# Patient Record
Sex: Female | Born: 1985 | Race: White | Hispanic: No | Marital: Single | Smoking: Former smoker
Health system: Southern US, Community
[De-identification: ages and names within clinical notes are randomized; demographics above are authoritative.]

## PROBLEM LIST (undated history)

## (undated) DIAGNOSIS — F32A Depression, unspecified: Secondary | ICD-10-CM

## (undated) DIAGNOSIS — R51 Headache: Secondary | ICD-10-CM

## (undated) DIAGNOSIS — K219 Gastro-esophageal reflux disease without esophagitis: Secondary | ICD-10-CM

## (undated) DIAGNOSIS — Z87442 Personal history of urinary calculi: Secondary | ICD-10-CM

## (undated) DIAGNOSIS — F329 Major depressive disorder, single episode, unspecified: Secondary | ICD-10-CM

## (undated) DIAGNOSIS — G4700 Insomnia, unspecified: Secondary | ICD-10-CM

## (undated) DIAGNOSIS — R519 Headache, unspecified: Secondary | ICD-10-CM

## (undated) DIAGNOSIS — F419 Anxiety disorder, unspecified: Secondary | ICD-10-CM

## (undated) HISTORY — PX: WISDOM TOOTH EXTRACTION: SHX21

## (undated) HISTORY — PX: MULTIPLE TOOTH EXTRACTIONS: SHX2053

---

## 2011-10-19 NOTE — L&D Delivery Note (Signed)
Delivery Note At 9:48 PM a viable female was delivered via Vaginal, Spontaneous Delivery (Presentation: Middle Occiput Anterior).  APGAR: 9, 9; weight pending .   Placenta status: Intact, Spontaneous.  Cord: 3 vessels.  Anesthesia: None  Episiotomy: None Lacerations: None Est. Blood Loss (mL): 250  Mom to postpartum.  Baby to nursery-stable.  Lawernce Pitts 05/25/2012, 10:50 PM

## 2011-10-19 NOTE — L&D Delivery Note (Signed)
I was present for this delivery and agree with the above resident's note.  LEFTWICH-KIRBY, Matteus Mcnelly Certified Nurse-Midwife 

## 2012-05-21 ENCOUNTER — Inpatient Hospital Stay (HOSPITAL_COMMUNITY)
Admission: AD | Admit: 2012-05-21 | Discharge: 2012-05-21 | Disposition: A | Discharge status: Home / Self Care | Payer: Medicaid Other | Source: Ambulatory Visit | Attending: Obstetrics & Gynecology | Admitting: Obstetrics & Gynecology

## 2012-05-21 ENCOUNTER — Encounter (HOSPITAL_COMMUNITY): Payer: Self-pay

## 2012-05-21 DIAGNOSIS — M545 Low back pain, unspecified: Secondary | ICD-10-CM | POA: Insufficient documentation

## 2012-05-21 DIAGNOSIS — O479 False labor, unspecified: Secondary | ICD-10-CM

## 2012-05-21 DIAGNOSIS — O99891 Other specified diseases and conditions complicating pregnancy: Secondary | ICD-10-CM | POA: Insufficient documentation

## 2012-05-21 DIAGNOSIS — N949 Unspecified condition associated with female genital organs and menstrual cycle: Secondary | ICD-10-CM | POA: Insufficient documentation

## 2012-05-21 HISTORY — DX: Anxiety disorder, unspecified: F41.9

## 2012-05-21 HISTORY — DX: Depression, unspecified: F32.A

## 2012-05-21 HISTORY — DX: Major depressive disorder, single episode, unspecified: F32.9

## 2012-05-21 HISTORY — DX: Insomnia, unspecified: G47.00

## 2012-05-21 LAB — CBC
Hemoglobin: 10.1 g/dL — ABNORMAL LOW (ref 12.0–15.0)
RBC: 4.06 MIL/uL (ref 3.87–5.11)

## 2012-05-21 LAB — URINALYSIS, ROUTINE W REFLEX MICROSCOPIC
Bilirubin Urine: NEGATIVE
Hgb urine dipstick: NEGATIVE
Specific Gravity, Urine: 1.015 (ref 1.005–1.030)
Urobilinogen, UA: 0.2 mg/dL (ref 0.0–1.0)
pH: 6.5 (ref 5.0–8.0)

## 2012-05-21 LAB — DIFFERENTIAL
Basophils Absolute: 0 10*3/uL (ref 0.0–0.1)
Basophils Relative: 0 % (ref 0–1)
Eosinophils Absolute: 0.1 10*3/uL (ref 0.0–0.7)
Eosinophils Relative: 1 % (ref 0–5)
Lymphocytes Relative: 6 % — ABNORMAL LOW (ref 12–46)
Lymphs Abs: 0.8 10*3/uL (ref 0.7–4.0)
Neutro Abs: 12.3 10*3/uL — ABNORMAL HIGH (ref 1.7–7.7)
Neutrophils Relative %: 87 % — ABNORMAL HIGH (ref 43–77)

## 2012-05-21 LAB — RUBELLA SCREEN: Rubella: 48.1 IU/mL — ABNORMAL HIGH

## 2012-05-21 LAB — ABO/RH: ABO/RH(D): A POS

## 2012-05-21 LAB — RPR: RPR Ser Ql: NONREACTIVE

## 2012-05-21 MED ORDER — ZOLPIDEM TARTRATE 5 MG PO TABS
5.0000 mg | ORAL_TABLET | ORAL | Status: AC
  Administered 2012-05-21: 5 mg via ORAL
  Filled 2012-05-21: qty 1

## 2012-05-21 NOTE — MAU Note (Signed)
Patient is experiencing lower back pain and lower pelvic pain, very uncomfortable and very painful, rates pain 10 out of 10, no vaginal bleeding, milky white scant discharge

## 2012-05-23 ENCOUNTER — Encounter (HOSPITAL_COMMUNITY): Payer: Self-pay | Admitting: *Deleted

## 2012-05-23 ENCOUNTER — Inpatient Hospital Stay (HOSPITAL_COMMUNITY)
Admission: AD | Admit: 2012-05-23 | Discharge: 2012-05-24 | Disposition: A | Discharge status: Home / Self Care | Payer: Medicaid Other | Source: Ambulatory Visit | Attending: Family Medicine | Admitting: Family Medicine

## 2012-05-23 DIAGNOSIS — O479 False labor, unspecified: Secondary | ICD-10-CM | POA: Insufficient documentation

## 2012-05-23 LAB — CULTURE, BETA STREP (GROUP B ONLY)

## 2012-05-23 NOTE — MAU Note (Signed)
Patient presented with increased UCs since 2030.  Denies leaking or bleeding and feeling positive fetal movement.

## 2012-05-23 NOTE — MAU Note (Signed)
HURT BAD AT 830PM.      WAS SEEN LAST  IN MAY- IN [facility].  WAS IN MAU ON Sunday- 4 CM- UC STOPPED - D/C HOME.     DENIES HSV AN MRSA.

## 2012-05-25 ENCOUNTER — Inpatient Hospital Stay (HOSPITAL_COMMUNITY)
Admission: AD | Admit: 2012-05-25 | Discharge: 2012-05-27 | DRG: 775 | Disposition: A | Discharge status: Home / Self Care | Payer: Medicaid Other | Source: Ambulatory Visit | Attending: Obstetrics & Gynecology | Admitting: Obstetrics & Gynecology

## 2012-05-25 ENCOUNTER — Encounter (HOSPITAL_COMMUNITY): Payer: Self-pay | Admitting: *Deleted

## 2012-05-25 LAB — CBC
HCT: 33.2 % — ABNORMAL LOW (ref 36.0–46.0)
Hemoglobin: 10.8 g/dL — ABNORMAL LOW (ref 12.0–15.0)
MCHC: 32.5 g/dL (ref 30.0–36.0)
WBC: 15.1 10*3/uL — ABNORMAL HIGH (ref 4.0–10.5)

## 2012-05-25 MED ORDER — IBUPROFEN 600 MG PO TABS
600.0000 mg | ORAL_TABLET | Freq: Four times a day (QID) | ORAL | Status: DC | PRN
  Administered 2012-05-25: 600 mg via ORAL
  Filled 2012-05-25: qty 1

## 2012-05-25 MED ORDER — TETANUS-DIPHTH-ACELL PERTUSSIS 5-2.5-18.5 LF-MCG/0.5 IM SUSP: 0.5000 mL | Freq: Once | INTRAMUSCULAR | Status: DC

## 2012-05-25 MED ORDER — OXYCODONE-ACETAMINOPHEN 5-325 MG PO TABS
1.0000 | ORAL_TABLET | ORAL | Status: DC | PRN
Start: 2012-05-25 — End: 2012-05-27

## 2012-05-25 MED ORDER — LIDOCAINE HCL (PF) 1 % IJ SOLN: 30.0000 mL | INTRAMUSCULAR | Status: DC | PRN

## 2012-05-25 MED ORDER — BENZOCAINE-MENTHOL 20-0.5 % EX AERO: 1.0000 "application " | INHALATION_SPRAY | CUTANEOUS | Status: DC | PRN

## 2012-05-25 MED ORDER — ONDANSETRON HCL 4 MG/2ML IJ SOLN: 4.0000 mg | Freq: Four times a day (QID) | INTRAMUSCULAR | Status: DC | PRN

## 2012-05-25 MED ORDER — OXYTOCIN 40 UNITS IN LACTATED RINGERS INFUSION - SIMPLE MED
INTRAVENOUS | Status: AC
  Administered 2012-05-25: 40 [IU]
  Filled 2012-05-25: qty 1000

## 2012-05-25 MED ORDER — DIPHENHYDRAMINE HCL 25 MG PO CAPS: 25.0000 mg | ORAL_CAPSULE | Freq: Four times a day (QID) | ORAL | Status: DC | PRN

## 2012-05-25 MED ORDER — LACTATED RINGERS IV SOLN: 500.0000 mL | INTRAVENOUS | Status: DC | PRN

## 2012-05-25 MED ORDER — CITRIC ACID-SODIUM CITRATE 334-500 MG/5ML PO SOLN: 30.0000 mL | ORAL | Status: DC | PRN

## 2012-05-25 MED ORDER — OXYCODONE-ACETAMINOPHEN 5-325 MG PO TABS
1.0000 | ORAL_TABLET | ORAL | Status: DC | PRN
  Administered 2012-05-25: 1 via ORAL
  Filled 2012-05-25: qty 1

## 2012-05-25 MED ORDER — LIDOCAINE HCL (PF) 1 % IJ SOLN
INTRAMUSCULAR | Status: AC
  Filled 2012-05-25: qty 30

## 2012-05-25 MED ORDER — IBUPROFEN 600 MG PO TABS
600.0000 mg | ORAL_TABLET | Freq: Four times a day (QID) | ORAL | Status: DC
  Administered 2012-05-26 – 2012-05-27 (×5): 600 mg via ORAL
  Filled 2012-05-25 (×6): qty 1

## 2012-05-25 MED ORDER — WITCH HAZEL-GLYCERIN EX PADS: 1.0000 "application " | MEDICATED_PAD | CUTANEOUS | Status: DC | PRN

## 2012-05-25 MED ORDER — OXYTOCIN BOLUS FROM INFUSION
250.0000 mL | Freq: Once | INTRAVENOUS | Status: DC
  Filled 2012-05-25: qty 500

## 2012-05-25 MED ORDER — ACETAMINOPHEN 325 MG PO TABS: 650.0000 mg | ORAL_TABLET | ORAL | Status: DC | PRN

## 2012-05-25 MED ORDER — PRENATAL MULTIVITAMIN CH
1.0000 | ORAL_TABLET | Freq: Every day | ORAL | Status: DC
  Administered 2012-05-27: 1 via ORAL
  Filled 2012-05-25 (×2): qty 1

## 2012-05-25 MED ORDER — FLEET ENEMA 7-19 GM/118ML RE ENEM: 1.0000 | ENEMA | RECTAL | Status: DC | PRN

## 2012-05-25 MED ORDER — DIBUCAINE 1 % RE OINT: 1.0000 "application " | TOPICAL_OINTMENT | RECTAL | Status: DC | PRN

## 2012-05-25 MED ORDER — SENNOSIDES-DOCUSATE SODIUM 8.6-50 MG PO TABS
2.0000 | ORAL_TABLET | Freq: Every day | ORAL | Status: DC
  Administered 2012-05-26: 2 via ORAL

## 2012-05-25 MED ORDER — OXYTOCIN 40 UNITS IN LACTATED RINGERS INFUSION - SIMPLE MED: 62.5000 mL/h | Freq: Once | INTRAVENOUS | Status: DC

## 2012-05-25 MED ORDER — LANOLIN HYDROUS EX OINT: TOPICAL_OINTMENT | CUTANEOUS | Status: DC | PRN

## 2012-05-25 MED ORDER — SIMETHICONE 80 MG PO CHEW: 80.0000 mg | CHEWABLE_TABLET | ORAL | Status: DC | PRN

## 2012-05-25 MED ORDER — LACTATED RINGERS IV SOLN: INTRAVENOUS | Status: DC

## 2012-05-25 MED ORDER — ONDANSETRON HCL 4 MG/2ML IJ SOLN: 4.0000 mg | INTRAMUSCULAR | Status: DC | PRN

## 2012-05-25 MED ORDER — ZOLPIDEM TARTRATE 5 MG PO TABS: 5.0000 mg | ORAL_TABLET | Freq: Every evening | ORAL | Status: DC | PRN

## 2012-05-25 MED ORDER — ONDANSETRON HCL 4 MG PO TABS: 4.0000 mg | ORAL_TABLET | ORAL | Status: DC | PRN

## 2012-05-25 NOTE — H&P (Signed)
Kristine Mosley is a 26 y.o. female presenting for regular ctx and bloody show this afternoon. Maternal Medical History:  Reason for admission: Reason for admission: contractions.  Contractions: Onset was 1-2 hours ago.   Frequency: regular.   Perceived severity is strong.    Fetal activity: Perceived fetal activity is decreased.   Last perceived fetal movement was within the past 12 hours.     Pt reports normal u/s and genetic screen but records unavailable. OB History    Grav Para Term Preterm Abortions TAB SAB Ect Mult Living   3 1 1  1  1   1      Past Medical History  Diagnosis Date  . Depression   . Anxiety   . Insomnia    History reviewed. No pertinent past surgical history. Family History: family history includes Cancer in her father and Heart disease in her mother. Social History:  reports that she has been smoking Cigarettes.  She has a 10 pack-year smoking history. She does not have any smokeless tobacco history on file. She reports that she does not drink alcohol or use illicit drugs.   Prenatal Transfer Tool  Maternal Diabetes: No Genetic Screening: Normal Maternal Ultrasounds/Referrals: Normal Fetal Ultrasounds or other Referrals:  None Maternal Substance Abuse:  No Significant Maternal Medications:  None Significant Maternal Lab Results:  None Other Comments:  limited PNC  Review of Systems  Constitutional: Negative.   HENT: Negative.   Eyes: Negative.   Respiratory: Negative.   Cardiovascular: Negative.   Gastrointestinal: Negative.   Genitourinary: Negative.   Skin: Negative.   Neurological: Negative.    SVE 6-7/100/-1 done by Marian Meneely,SNM Maternal Exam:  Uterine Assessment: Contraction strength is moderate.  Contraction frequency is regular.   Abdomen: Patient reports no abdominal tenderness. Fetal presentation: vertex  Introitus: Normal vulva. Normal vagina.    Fetal Exam Fetal Monitor Review: Mode: ultrasound.   Baseline rate: 130.    Variability: moderate (6-25 bpm).   Pattern: accelerations present and no decelerations.    Fetal State Assessment: Category I - tracings are normal.     Physical Exam  Constitutional: She is oriented to person, place, and time. She appears well-developed.  HENT:  Head: Normocephalic.  Neck: Normal range of motion.  Cardiovascular: Normal rate.   Respiratory: Effort normal.  GI: Soft.  Genitourinary: Vagina normal.  Musculoskeletal: Normal range of motion.  Neurological: She is alert and oriented to person, place, and time.  Skin: Skin is warm and dry.  Psychiatric: She has a normal mood and affect.    Prenatal labs: ABO, Rh: --/--/A POS, A POS (08/04 0214) Antibody: NEG (08/04 0214) Rubella: 48.1 (08/04 0229) RPR: NON REACTIVE (08/04 0229)  HBsAg: NEGATIVE (08/04 0229)  HIV:    GBS:   NEG  Assessment/Plan: IUP@ [redacted]w[redacted]d Active labor Limited PNC GBS neg  Admit, fetal monitoring per unit protocol, epidural PRN, expectant management.   Lawernce Pitts 05/25/2012, 9:23 PM

## 2012-05-25 NOTE — MAU Note (Signed)
Elbert Ewings Leftwich-Kirby CNM notified of pt.  Orders rec'd.

## 2012-05-25 NOTE — Treatment Plan (Signed)
Patient may go to room 172

## 2012-05-25 NOTE — MAU Note (Signed)
Pt presents with complaints of bleeding this pm approx ago. Denies any leakage of fluid. States baby is active.

## 2012-05-26 LAB — RPR: RPR Ser Ql: NONREACTIVE

## 2012-05-26 MED ORDER — IBUPROFEN 600 MG PO TABS: 600.0000 mg | ORAL_TABLET | Freq: Four times a day (QID) | ORAL | Status: AC
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
## 2012-05-26 NOTE — Progress Notes (Signed)
I have seen this patient and agree with the above student's note.    LEFTWICH-KIRBY, Valeria Krisko Certified Nurse-Midwife 

## 2012-05-26 NOTE — Progress Notes (Signed)
Post Partum Day 1 Subjective: Feeling good no complaints, up ad lib, voiding and tolerating PO Would like to d/c home later today.  Objective: Blood pressure 107/71, pulse 78, temperature 98.3 F (36.8 C), temperature source Oral, resp. rate 18, unknown if currently breastfeeding.  Physical Exam:  General: alert and cooperative ABD: soft, non-tender Lochia: appropriate Uterine Fundus: firm DVT Evaluation: No evidence of DVT seen on physical exam.   Basename 05/25/12 2115  HGB 10.8*  HCT 33.2*    Assessment/Plan: pp day 1, SVD Breastfeeding Contraception desires BTL, will consent and initiate 30 day papers  Will re-evaluate later today for discharge home.  LOS: 1 day   Kristine Mosley 05/26/2012, 7:45 AM

## 2012-05-26 NOTE — Progress Notes (Signed)
UR chart review completed.  

## 2012-05-26 NOTE — Discharge Summary (Signed)
Obstetric Discharge Summary Reason for Admission: onset of labor Prenatal Procedures: NST and ultrasound Intrapartum Procedures: spontaneous vaginal delivery Postpartum Procedures: none Complications-Operative and Postpartum: none Hemoglobin  Date Value Range Status  05/25/2012 10.8* 12.0 - 15.0 g/dL Final     HCT  Date Value Range Status  05/25/2012 33.2* 36.0 - 46.0 % Final    Physical Exam:  General: alert and cooperative Lochia: appropriate Uterine Fundus: firm Incision: n/a DVT Evaluation: No evidence of DVT seen on physical exam.  Discharge Diagnoses: Term Pregnancy-delivered  Discharge Information: Date: 05/26/2012 Activity: pelvic rest Diet: routine Medications: Ibuprofen Condition: stable Instructions: refer to practice specific booklet Discharge to: home Follow-up Information    Schedule an appointment as soon as possible for a visit with [facility].         Newborn Data: Live born female  Birth Weight: 9 lb 6.6 oz (4270 g) APGAR: 9, 9  Home with mother.  Kristine Mosley E. 05/26/2012, 8:58 AM

## 2012-05-27 NOTE — Discharge Summary (Addendum)
Obstetric Discharge Summary Reason for Admission: onset of labor Prenatal Procedures: none Intrapartum Procedures: spontaneous vaginal delivery Postpartum Procedures: none Complications-Operative and Postpartum: none Hemoglobin  Date Value Range Status  05/25/2012 10.8* 12.0 - 15.0 g/dL Final     HCT  Date Value Range Status  05/25/2012 33.2* 36.0 - 46.0 % Final    Physical Exam:  Filed Vitals:   05/27/12 0527  BP: 118/76  Pulse: 76  Temp: 98 F (36.7 C)  Resp: 18    General: alert, cooperative and no distress Lochia: appropriate Uterine Fundus: firm DVT Evaluation: No evidence of DVT seen on physical exam.  Discharge Diagnoses: Term Pregnancy-delivered  Discharge Information: Date: 05/27/2012 Activity: pelvic rest for 6 weeks Diet: routine Medications: Ibuprofen Condition: stable Instructions: refer to practice specific booklet Discharge to: home Follow-up Information    Schedule an appointment as soon as possible for a visit with [facility] or [facility]. (Make appt at [facility] [phone] in 2 wks  to set up tubal ligation.)          Newborn Data: Live born female  Birth Weight: 9 lb 6.6 oz (4270 g) APGAR: 9, 9 Breastfeeding. Plans to have tubal ligation post-partum. Home with baby.  Napoleon Form 05/27/2012, 6:22 AM

## 2012-06-22 ENCOUNTER — Ambulatory Visit (INDEPENDENT_AMBULATORY_CARE_PROVIDER_SITE_OTHER): Payer: Medicaid Other | Admitting: Obstetrics & Gynecology

## 2012-06-22 ENCOUNTER — Encounter: Payer: Self-pay | Admitting: Obstetrics & Gynecology

## 2012-06-22 DIAGNOSIS — O99345 Other mental disorders complicating the puerperium: Secondary | ICD-10-CM

## 2012-06-22 DIAGNOSIS — Z3049 Encounter for surveillance of other contraceptives: Secondary | ICD-10-CM

## 2012-06-22 MED ORDER — MEDROXYPROGESTERONE ACETATE 150 MG/ML IM SUSP
150.0000 mg | Freq: Once | INTRAMUSCULAR | Status: AC
  Administered 2012-06-22: 150 mg via INTRAMUSCULAR

## 2012-06-22 MED ORDER — MEDROXYPROGESTERONE ACETATE 150 MG/ML IM SUSP: 150.0000 mg | INTRAMUSCULAR | Status: DC

## 2012-06-22 NOTE — Addendum Note (Signed)
Addended by: Sherre Lain A on: 06/22/2012 03:30 PM   Modules accepted: Orders

## 2012-06-22 NOTE — Progress Notes (Signed)
States here for postpartum visit , has decided to not get btl, wants depoprovera

## 2012-06-22 NOTE — Patient Instructions (Signed)
Contraception Choices Contraception (birth control) is the use of any methods or devices to prevent pregnancy. Below are some methods to help avoid pregnancy. HORMONAL METHODS   Contraceptive implant. This is a thin, plastic tube containing progesterone hormone. It does not contain estrogen hormone. Your caregiver inserts the tube in the inner part of the upper arm. The tube can remain in place for up to 3 years. After 3 years, the implant must be removed. The implant prevents the ovaries from releasing an egg (ovulation), thickens the cervical mucus which prevents sperm from entering the uterus, and thins the lining of the inside of the uterus.   Progesterone-only injections. These injections are given every 3 months by your caregiver to prevent pregnancy. This synthetic progesterone hormone stops the ovaries from releasing eggs. It also thickens cervical mucus and changes the uterine lining. This makes it harder for sperm to survive in the uterus.   Birth control pills. These pills contain estrogen and progesterone hormone. They work by stopping the egg from forming in the ovary (ovulation). Birth control pills are prescribed by a caregiver.Birth control pills can also be used to treat heavy periods.   Minipill. This type of birth control pill contains only the progesterone hormone. They are taken every day of each month and must be prescribed by your caregiver.   Birth control patch. The patch contains hormones similar to those in birth control pills. It must be changed once a week and is prescribed by a caregiver.   Vaginal ring. The ring contains hormones similar to those in birth control pills. It is left in the vagina for 3 weeks, removed for 1 week, and then a new one is put back in place. The patient must be comfortable inserting and removing the ring from the vagina.A caregiver's prescription is necessary.   Emergency contraception. Emergency contraceptives prevent pregnancy after  unprotected sexual intercourse. This pill can be taken right after sex or up to 5 days after unprotected sex. It is most effective the sooner you take the pills after having sexual intercourse. Emergency contraceptive pills are available without a prescription. Check with your pharmacist. Do not use emergency contraception as your only form of birth control.  BARRIER METHODS   Female condom. This is a thin sheath (latex or rubber) that is worn over the penis during sexual intercourse. It can be used with spermicide to increase effectiveness.   Female condom. This is a soft, loose-fitting sheath that is put into the vagina before sexual intercourse.   Diaphragm. This is a soft, latex, dome-shaped barrier that must be fitted by a caregiver. It is inserted into the vagina, along with a spermicidal jelly. It is inserted before intercourse. The diaphragm should be left in the vagina for 6 to 8 hours after intercourse.   Cervical cap. This is a round, soft, latex or plastic cup that fits over the cervix and must be fitted by a caregiver. The cap can be left in place for up to 48 hours after intercourse.   Sponge. This is a soft, circular piece of polyurethane foam. The sponge has spermicide in it. It is inserted into the vagina after wetting it and before sexual intercourse.   Spermicides. These are chemicals that kill or block sperm from entering the cervix and uterus. They come in the form of creams, jellies, suppositories, foam, or tablets. They do not require a prescription. They are inserted into the vagina with an applicator before having sexual intercourse. The process must be   repeated every time you have sexual intercourse.  INTRAUTERINE CONTRACEPTION  Intrauterine device (IUD). This is a T-shaped device that is put in a woman's uterus during a menstrual period to prevent pregnancy. There are 2 types:   Copper IUD. This type of IUD is wrapped in copper wire and is placed inside the uterus. Copper  makes the uterus and fallopian tubes produce a fluid that kills sperm. It can stay in place for 10 years.   Hormone IUD. This type of IUD contains the hormone progestin (synthetic progesterone). The hormone thickens the cervical mucus and prevents sperm from entering the uterus, and it also thins the uterine lining to prevent implantation of a fertilized egg. The hormone can weaken or kill the sperm that get into the uterus. It can stay in place for 5 years.  PERMANENT METHODS OF CONTRACEPTION  Female tubal ligation. This is when the woman's fallopian tubes are surgically sealed, tied, or blocked to prevent the egg from traveling to the uterus.   Female sterilization. This is when the female has the tubes that carry sperm tied off (vasectomy).This blocks sperm from entering the vagina during sexual intercourse. After the procedure, the man can still ejaculate fluid (semen).  NATURAL PLANNING METHODS  Natural family planning. This is not having sexual intercourse or using a barrier method (condom, diaphragm, cervical cap) on days the woman could become pregnant.   Calendar method. This is keeping track of the length of each menstrual cycle and identifying when you are fertile.   Ovulation method. This is avoiding sexual intercourse during ovulation.   Symptothermal method. This is avoiding sexual intercourse during ovulation, using a thermometer and ovulation symptoms.   Post-ovulation method. This is timing sexual intercourse after you have ovulated.  Regardless of which type or method of contraception you choose, it is important that you use condoms to protect against the transmission of sexually transmitted diseases (STDs). Talk with your caregiver about which form of contraception is most appropriate for you. Document Released: 10/04/2005 Document Revised: 09/23/2011 Document Reviewed: 02/10/2011 [employer] Patient Information 2012 [employer]. 

## 2012-06-22 NOTE — Progress Notes (Signed)
  Subjective:     Kristine Mosley is a 26 y.o. female who presents for a postpartum visit. She is 4 week postpartum following a spontaneous vaginal delivery. I have fully reviewed the prenatal and intrapartum course. The delivery was at 39 gestational weeks. Outcome: spontaneous vaginal delivery. Anesthesia: epidural. Postpartum course has been uncomplicated. Baby's course has been unremarkable. Baby is feeding by breast. Bleeding thin lochia. Bowel function is normal. Bladder function is normal. Patient is not sexually active. Contraception method is none. Postpartum depression screening: negative.  The following portions of the patient's history were reviewed and updated as appropriate: past family history and past surgical history.  Review of Systems Pertinent items are noted in HPI.   Objective:    BP 109/73  Pulse 97  Temp 98.2 F (36.8 C)  Wt 124 lb 1.6 oz (56.291 kg)  Breastfeeding? Yes  General:  cooperative and no distress                                      Assessment:    6 week postpartum exam.   Plan:    1. Contraception: Depo-Provera injections 2. Encouraged tobacco cessation 3. Follow up in: 3 months or as needed.

## 2012-09-08 ENCOUNTER — Ambulatory Visit (INDEPENDENT_AMBULATORY_CARE_PROVIDER_SITE_OTHER): Payer: Medicaid Other | Admitting: General Practice

## 2012-09-08 VITALS — BP 116/73 | HR 115 | Temp 96.5°F | Ht 68.5 in | Wt 119.5 lb

## 2012-09-08 DIAGNOSIS — Z3049 Encounter for surveillance of other contraceptives: Secondary | ICD-10-CM

## 2012-09-08 MED ORDER — MEDROXYPROGESTERONE ACETATE 150 MG/ML IM SUSP
150.0000 mg | Freq: Once | INTRAMUSCULAR | Status: AC
  Administered 2012-09-08: 150 mg via INTRAMUSCULAR

## 2012-11-24 ENCOUNTER — Ambulatory Visit: Payer: Medicaid Other

## 2012-11-27 ENCOUNTER — Ambulatory Visit (INDEPENDENT_AMBULATORY_CARE_PROVIDER_SITE_OTHER): Payer: Medicaid Other

## 2012-11-27 VITALS — BP 102/68 | HR 81 | Temp 97.1°F | Ht 69.0 in | Wt 125.3 lb

## 2012-11-27 DIAGNOSIS — Z3049 Encounter for surveillance of other contraceptives: Secondary | ICD-10-CM

## 2012-11-27 MED ORDER — MEDROXYPROGESTERONE ACETATE 150 MG/ML IM SUSP
150.0000 mg | Freq: Once | INTRAMUSCULAR | Status: AC
  Administered 2012-11-27: 150 mg via INTRAMUSCULAR

## 2013-02-12 ENCOUNTER — Ambulatory Visit (INDEPENDENT_AMBULATORY_CARE_PROVIDER_SITE_OTHER): Payer: Medicaid Other

## 2013-02-12 VITALS — BP 121/89 | HR 105 | Temp 98.4°F | Ht 66.0 in | Wt 125.5 lb

## 2013-02-12 DIAGNOSIS — Z3049 Encounter for surveillance of other contraceptives: Secondary | ICD-10-CM

## 2013-02-12 DIAGNOSIS — Z3042 Encounter for surveillance of injectable contraceptive: Secondary | ICD-10-CM

## 2013-02-12 MED ORDER — MEDROXYPROGESTERONE ACETATE 150 MG/ML IM SUSP
150.0000 mg | Freq: Once | INTRAMUSCULAR | Status: AC
  Administered 2013-02-12: 150 mg via INTRAMUSCULAR

## 2015-02-28 ENCOUNTER — Emergency Department (HOSPITAL_COMMUNITY)
Admission: EM | Admit: 2015-02-28 | Discharge: 2015-02-28 | Disposition: A | Discharge status: Home / Self Care | Payer: Medicaid Other | Attending: Emergency Medicine | Admitting: Emergency Medicine

## 2015-02-28 ENCOUNTER — Emergency Department (HOSPITAL_COMMUNITY): Payer: Medicaid Other

## 2015-02-28 ENCOUNTER — Encounter (HOSPITAL_COMMUNITY): Payer: Self-pay | Admitting: Emergency Medicine

## 2015-02-28 DIAGNOSIS — Z8669 Personal history of other diseases of the nervous system and sense organs: Secondary | ICD-10-CM | POA: Insufficient documentation

## 2015-02-28 DIAGNOSIS — J069 Acute upper respiratory infection, unspecified: Secondary | ICD-10-CM | POA: Insufficient documentation

## 2015-02-28 DIAGNOSIS — R112 Nausea with vomiting, unspecified: Secondary | ICD-10-CM | POA: Insufficient documentation

## 2015-02-28 DIAGNOSIS — Z8659 Personal history of other mental and behavioral disorders: Secondary | ICD-10-CM | POA: Diagnosis not present

## 2015-02-28 DIAGNOSIS — Z720 Tobacco use: Secondary | ICD-10-CM | POA: Insufficient documentation

## 2015-02-28 DIAGNOSIS — Z79899 Other long term (current) drug therapy: Secondary | ICD-10-CM | POA: Diagnosis not present

## 2015-02-28 DIAGNOSIS — F17200 Nicotine dependence, unspecified, uncomplicated: Secondary | ICD-10-CM

## 2015-02-28 DIAGNOSIS — M791 Myalgia: Secondary | ICD-10-CM | POA: Diagnosis not present

## 2015-02-28 DIAGNOSIS — R062 Wheezing: Secondary | ICD-10-CM

## 2015-02-28 DIAGNOSIS — J029 Acute pharyngitis, unspecified: Secondary | ICD-10-CM | POA: Diagnosis present

## 2015-02-28 MED ORDER — IBUPROFEN 400 MG PO TABS
600.0000 mg | ORAL_TABLET | Freq: Once | ORAL | Status: AC
  Administered 2015-02-28: 600 mg via ORAL
  Filled 2015-02-28: qty 2

## 2015-02-28 MED ORDER — PREDNISONE 20 MG PO TABS: ORAL_TABLET | ORAL | Status: DC

## 2015-02-28 MED ORDER — ALBUTEROL SULFATE HFA 108 (90 BASE) MCG/ACT IN AERS
2.0000 | INHALATION_SPRAY | Freq: Once | RESPIRATORY_TRACT | Status: AC
  Administered 2015-02-28: 2 via RESPIRATORY_TRACT
  Filled 2015-02-28: qty 6.7

## 2015-02-28 MED ORDER — ONDANSETRON 4 MG PO TBDP
4.0000 mg | ORAL_TABLET | Freq: Once | ORAL | Status: AC
  Administered 2015-02-28: 4 mg via ORAL
  Filled 2015-02-28: qty 1

## 2015-02-28 MED ORDER — PREDNISONE 20 MG PO TABS
40.0000 mg | ORAL_TABLET | Freq: Once | ORAL | Status: AC
  Administered 2015-02-28: 40 mg via ORAL
  Filled 2015-02-28: qty 2

## 2015-02-28 NOTE — Discharge Instructions (Signed)
Return to the emergency room with worsening of symptoms, new symptoms or with symptoms that are concerning , , especially fevers, stiff neck, worsening headache, nausea/vomiting, visual changes or slurred speech, chest pain, shortness of breath, cough with thick colored mucous or blood Drink plenty of fluids with electrolytes especially Gatorade. OTC cold medications such as mucinex, nyquil, dayquil are recommended. Chloraseptic for sore throat. Prednisone for next four days. Albuterol inhaler as need every 4-6 hours. Please call your doctor for a followup appointment within 24-48 hours. When you talk to your doctor please let them know that you were seen in the emergency department and have them acquire all of your records so that they can discuss the findings with you and formulate a treatment plan to fully care for your new and ongoing problems. If you do not have a primary care provider please call the number below under ED resources to establish care with a provider and follow up.  Read below information and follow recommendations.  Chronic Obstructive Pulmonary Disease Chronic obstructive pulmonary disease (COPD) is a common lung condition in which airflow from the lungs is limited. COPD is a general term that can be used to describe many different lung problems that limit airflow, including both chronic bronchitis and emphysema. If you have COPD, your lung function will probably never return to normal, but there are measures you can take to improve lung function and make yourself feel better.  CAUSES   Smoking (common).   Exposure to secondhand smoke.   Genetic problems.  Chronic inflammatory lung diseases or recurrent infections. SYMPTOMS   Shortness of breath, especially with physical activity.   Deep, persistent (chronic) cough with a large amount of thick mucus.   Wheezing.   Rapid breaths (tachypnea).   Gray or bluish discoloration (cyanosis) of the skin, especially in  fingers, toes, or lips.   Fatigue.   Weight loss.   Frequent infections or episodes when breathing symptoms become much worse (exacerbations).   Chest tightness. DIAGNOSIS  Your health care provider will take a medical history and perform a physical examination to make the initial diagnosis. Additional tests for COPD may include:   Lung (pulmonary) function tests.  Chest X-ray.  CT scan.  Blood tests. TREATMENT  Treatment available to help you feel better when you have COPD includes:   Inhaler and nebulizer medicines. These help manage the symptoms of COPD and make your breathing more comfortable.  Supplemental oxygen. Supplemental oxygen is only helpful if you have a low oxygen level in your blood.   Exercise and physical activity. These are beneficial for nearly all people with COPD. Some people may also benefit from a pulmonary rehabilitation program. HOME CARE INSTRUCTIONS   Take all medicines (inhaled or pills) as directed by your health care provider.  Avoid over-the-counter medicines or cough syrups that dry up your airway (such as antihistamines) and slow down the elimination of secretions unless instructed otherwise by your health care provider.   If you are a smoker, the most important thing that you can do is stop smoking. Continuing to smoke will cause further lung damage and breathing trouble. Ask your health care provider for help with quitting smoking. He or she can direct you to community resources or hospitals that provide support.  Avoid exposure to irritants such as smoke, chemicals, and fumes that aggravate your breathing.  Use oxygen therapy and pulmonary rehabilitation if directed by your health care provider. If you require home oxygen therapy, ask your health care  provider whether you should purchase a pulse oximeter to measure your oxygen level at home.   Avoid contact with individuals who have a contagious illness.  Avoid extreme temperature  and humidity changes.  Eat healthy foods. Eating smaller, more frequent meals and resting before meals may help you maintain your strength.  Stay active, but balance activity with periods of rest. Exercise and physical activity will help you maintain your ability to do things you want to do.  Preventing infection and hospitalization is very important when you have COPD. Make sure to receive all the vaccines your health care provider recommends, especially the pneumococcal and influenza vaccines. Ask your health care provider whether you need a pneumonia vaccine.  Learn and use relaxation techniques to manage stress.  Learn and use controlled breathing techniques as directed by your health care provider. Controlled breathing techniques include:   Pursed lip breathing. Start by breathing in (inhaling) through your nose for 1 second. Then, purse your lips as if you were going to whistle and breathe out (exhale) through the pursed lips for 2 seconds.   Diaphragmatic breathing. Start by putting one hand on your abdomen just above your waist. Inhale slowly through your nose. The hand on your abdomen should move out. Then purse your lips and exhale slowly. You should be able to feel the hand on your abdomen moving in as you exhale.   Learn and use controlled coughing to clear mucus from your lungs. Controlled coughing is a series of short, progressive coughs. The steps of controlled coughing are:  1. Lean your head slightly forward.  2. Breathe in deeply using diaphragmatic breathing.  3. Try to hold your breath for 3 seconds.  4. Keep your mouth slightly open while coughing twice.  5. Spit any mucus out into a tissue.  6. Rest and repeat the steps once or twice as needed. SEEK MEDICAL CARE IF:   You are coughing up more mucus than usual.   There is a change in the color or thickness of your mucus.   Your breathing is more labored than usual.   Your breathing is faster than usual.   SEEK IMMEDIATE MEDICAL CARE IF:   You have shortness of breath while you are resting.   You have shortness of breath that prevents you from:  Being able to talk.   Performing your usual physical activities.   You have chest pain lasting longer than 5 minutes.   Your skin color is more cyanotic than usual.  You measure low oxygen saturations for longer than 5 minutes with a pulse oximeter. MAKE SURE YOU:   Understand these instructions.  Will watch your condition.  Will get help right away if you are not doing well or get worse. Document Released: 07/14/2005 Document Revised: 02/18/2014 Document Reviewed: 05/31/2013 [facility] Patient Information 2015 [place], [place]. This information is not intended to replace advice given to you by your health care provider. Make sure you discuss any questions you have with your health care provider.   Emergency Department Resource Guide 1) Find a Doctor and Pay Out of Pocket Although you won't have to find out who is covered by your insurance plan, it is a good idea to ask around and get recommendations. You will then need to call the office and see if the doctor you have chosen will accept you as a new patient and what types of options they offer for patients who are self-pay. Some doctors offer discounts or will set up payment  plans for their patients who do not have insurance, but you will need to ask so you aren't surprised when you get to your appointment.  2) Contact Your Local Health Department Not all health departments have doctors that can see patients for sick visits, but many do, so it is worth a call to see if yours does. If you don't know where your local health department is, you can check in your phone book. The CDC also has a tool to help you locate your state's health department, and many state websites also have listings of all of their local health departments.  3) Find a [facility] If your illness is not likely to be  very severe or complicated, you may want to try a walk in clinic. These are popping up all over the country in pharmacies, drugstores, and shopping centers. They're usually staffed by nurse practitioners or physician assistants that have been trained to treat common illnesses and complaints. They're usually fairly quick and inexpensive. However, if you have serious medical issues or chronic medical problems, these are probably not your best option.  No Primary Care Doctor: - Call Health Connect at  [phone] - they can help you locate a primary care doctor that  accepts your insurance, provides certain services, etc. - Physician Referral Service- [phone]  Chronic Pain Problems: Organization         Address  Phone   Notes  [facility]  [phone] Patients need to be referred by their primary care doctor.   Medication Assistance: Organization         Address  Phone   Notes  [facility] Medication [address] [facility] [address] [place], [place] [address] [phone] --Must be a resident of [facility] -- Must have NO insurance coverage whatsoever (no Medicaid/ Medicare, etc.) -- The pt. MUST have a primary care doctor that directs their care regularly and follows them in the community   Med[facility]ist  [phone]   [employer]  [phone]    Agencies that provide inexpensive medical care: Organization         Address  Phone   Notes  [place]  [phone]   [facility]    [phone]   [facility] [place], [place] [address] [phone]   [place] [address], [place] [phone]   Planned Parenthood    [phone]   [facility]    [phone]   [place] and [place] [address], [place] Phone:  [phone], Fax:  [phone] Hours of Operation:  9 am - 6 pm, M-F.  Also  accepts Medicaid/Medicare and self-pay.  [facility] for [place], [address], [place] Phone: [phone], Fax: [phone]. Hours of Operation:  8:30 am - 5:30 pm, M-F.  Also accepts Medicaid and self-pay.  [facility] [address], [place] Phone: [phone]   [place], [place], [place] [phone], Ext. [phone] Mondays & Thursdays: 7-9 AM.  First 15 patients are seen on a first come, first serve basis.    [place] Providers:  Organization         Address  Phone   Notes  [facility] [address], [place] [phone] Also accepts self-pay  patients.  [facility] [address], [place]  [phone]   [address], [place] [phone]   [facility] [address] [place] [phone]   Lucianne Lei [address], [place]   [phone] Only accepts [place] Access [place] patients after they have their name applied to their card.   Self-Pay (no insurance) in [facility]:  Organization         Address  Phone   Notes  Sickle Cell Patients, [facility] [place] [phone]   [facility] [place] [phone]   [facility] [place]  [place], [place], [place] [phone]   [facility]/Dr. Osei-Bonsu  [address] [place] or [address], [place] [phone] Phone number for both [place] and [place] locations is the same.  Urgent Medical and [facility] [address] [place] [phone]   [facility] [address] [place] or [address] [phone]   [facility] [address],  [place] [phone], phone; [phone], fax Sees patients 1st and 3rd Saturday of every month.  Must not qualify for public or private insurance (i.e. Medicaid, Medicare, [place] Health Choice, Veterans' Benefits)  Household income should be no more than 200% of the poverty level The clinic cannot treat you if you are pregnant or think you are pregnant  Sexually transmitted diseases are not treated at the clinic.    Dental Care: Organization         Address  Phone  Notes  [facility] Department of [facility] [phone] Accepts children up to age 56 who are enrolled in [place] or [place]; pregnant women with a Medicaid card; and children who have applied for Medicaid or [place] Health Choice, but were declined, whose parents can pay a reduced fee at time of service.  [facility] Department of [facility]  [address], [place] [phone] Accepts children up to age 4 who are enrolled in [place] or [place]; pregnant women with a Medicaid card; and children who have applied for Medicaid or  Health Choice, but were declined, whose parents can pay a reduced fee at time of service.  [place] Adult Dental Access PROGRAM  [place] [phone] Patients are seen by appointment only. Walk-ins are not accepted. [place] will see patients 34 years of age and older. Monday - Tuesday (8am-5pm) Most Wednesdays (8:30-5pm) $30 per visit, cash only  [facility] Adult Dental Access PROGRAM  [address], [facility] [phone] Patients are seen by appointment only. Walk-ins are not accepted. [place] will see patients 11 years of age and older. One Wednesday Evening (Monthly: Volunteer Based).  $30 per visit, cash only  [place]  [phone] for adults; Children under age 29, call Graduate Pediatric Dentistry at [phone].  Children aged 71-14, please call [phone] to request a pediatric application.  Dental services are provided in all areas of dental care including fillings, crowns and bridges, complete and partial dentures, implants, gum treatment, root canals, and extractions. Preventive care is also provided. Treatment is provided to  both adults and children. Patients are selected via a lottery and there is often a waiting list.   [facility] [address] [place]  [phone] [email or web address]   [facility] [address] [place], [place] [phone], Ext. [phone] Second and Fourth Thursday of each month, opens at 6:30 AM; Clinic ends at 9 AM.  Patients are seen on a first-come first-served basis, and a limited number are seen during each clinic.   [facility]  [address] Hillard Danker [place], [place] [phone]   Eligibility Requirements You must have lived in [place], [place], or [place] counties for at least the last three months.   You cannot be eligible for state or federal sponsored [employer], including [employer], [place], or [employer].   You generally cannot be eligible for healthcare insurance through your employer.    How to apply: Eligibility screenings are held every Tuesday and Wednesday afternoon from 1:00 pm until 4:00 pm. You do not need an appointment for the interview!  [facility] [address], [place], [place]   [place]  [place] Department  [place]  [phone]    Behavioral Health Resources in the Community: Intensive Outpatient Programs Organization         Address  Phone  Notes  [place]o [place]. [address], [place], [place] [phone]   [facility] Outpatient [address], [place], [place]   ADS: Alcohol & Drug Svcs [address], [place], [place]   [place] [address],  [place], [place] or [phone]   Substance Abuse Resources Organization         Address  Phone  Notes  Alcohol and Drug Services  [phone]   [place]  [phone]   [facility] [place]   Chinita Pester  [phone]   Residential & Outpatient Substance Abuse Program  [phone]   Psychological Services Organization         Address  Phone  Notes  [facility]  [place]  [phone]   [place] [address], [place] or [phone]    Mobile Crisis Teams Organization         Address  Phone  Notes  [facility], Mobile Crisis Care Unit  [phone]   [facility]  [address] [place], [place]   [place] [address], [place] [phone]    Self-Help/Support Groups Organization         Address  Phone             Notes  [place]. of [place] - variety of support groups  [place] Call for more information  Narcotics Anonymous (NA), [facility] [address], [employer] [place]  2 meetings at this location   Special educational needs teacher         Address  Phone  Notes  [facility]idential [facility] [place],    [place]  [phone]   [facility]  [address], [place] [address], [place], [place]   [place], [place] [phone] Admissions: 8am-3pm M-F  Incentives Substance [place] [address]    [place], [place] [phone]  [facility] [address] Jadene Pierini [place], [place]   The [place].,  [place], [place]   [facility] - Intensive Outpatient [address] Kristeen [address], [place], [place]     [facility] ([place] [address]  [place], [place] [phone] or [phone]   [facility] (RTS) [address], [place], [place] Accepts Medicaid  [facility] [address]  [place] [phone] Substance Abuse/Addiction Treatment   [facility] Organization         Address  Phone  Notes  [facility]  [phone]   Domenic Schwab, PhD [address] Arlis Porta [place], [place]   [phone] or [phone]   [place], [place] [phone]   [facility] [address] [place], [place] [phone] Insurance/Medicaid/sponsorship through [facility] and Families [address] [place]                                    [place], [place] [phone] [place] [address], [place] [phone]    Dr. Adele Schilder  [phone]   [facility] Dept. 1) [address], [place] 2) [place] 3)  [place] [address], [place] [phone]  [phone]   [place] [phone] or [phone] (After Hours)

## 2015-02-28 NOTE — ED Notes (Signed)
Pt remained at 100% O2 saturation while ambulating down hallway and back

## 2015-02-28 NOTE — ED Notes (Signed)
Patient ambulated to restroom and tolerated well.  

## 2015-02-28 NOTE — ED Provider Notes (Signed)
CSN: [id number]     Arrival date & time 02/28/15  1046 History  This chart was scribed for non-physician practitioner, Al Corpus, working with Orpah Greek, MD by Molli Posey, ED Scribe. This patient was seen in room TR06C/TR06C and the patient's care was started at 10:59 AM.    Chief Complaint  Patient presents with  . Sore Throat   The history is provided by the patient. No language interpreter was used.   HPI Comments: Kristine Mosley is a 29 y.o. female with no significant medial history who presents to the Emergency Department complaining of an mild unchanged sore throat for the last 3 days. Pt reports associated fever with a maximum temperature of 100.2, chills, body aches, HA, productive cough with thick phlegm, chest and nasal congestion worse with cough. Pt states she had nausea and vomiting yesterday morning when she tried to eat but none today. Pt states she has not tried to eat today. She reports that lying flat on her back aggravates her symptoms. Pt states that she has tried taking dayquil without relief. Pt reports a history of smoking. She states there is no chance she is pregnant due to nexplanon. Pt denies abdominal pain or diarrhea at this time. Pt denies history of DVT, PE, recent surgery or trauma, malignancy, hemoptysis, exogenous estrogen use (pt uses nexplanon), unilateral leg swelling or tenderness, immobilization.  Past Medical History  Diagnosis Date  . Depression   . Anxiety   . Insomnia    History reviewed. No pertinent past surgical history. Family History  Problem Relation Age of Onset  . Heart disease Mother   . Cancer Father    History  Substance Use Topics  . Smoking status: Current Every Day Smoker -- 0.25 packs/day for 10 years    Types: Cigarettes  . Smokeless tobacco: Never Used  . Alcohol Use: No   OB History    Gravida Para Term Preterm AB TAB SAB Ectopic Multiple Living   3 2 2  1  1   2      Review of Systems   Constitutional: Positive for fever and chills.  HENT: Positive for congestion and sore throat.   Respiratory: Positive for cough and chest tightness.   Gastrointestinal: Positive for nausea and vomiting. Negative for abdominal pain and diarrhea.  Musculoskeletal: Positive for myalgias.  Neurological: Positive for headaches.   Allergies  Review of patient's allergies indicates no known allergies.  Home Medications   Prior to Admission medications   Medication Sig Start Date End Date Taking? Authorizing Provider  medroxyPROGESTERone (DEPO-PROVERA) 150 MG/ML injection Inject 1 mL (150 mg total) into the muscle every 3 (three) months. 06/22/12   Lavonia Drafts, MD  predniSONE (DELTASONE) 20 MG tablet 2 tabs po daily x 4 days 02/28/15   Al Corpus, PA-C  Prenatal Vit-Fe Fumarate-FA (PRENATAL MULTIVITAMIN) TABS Take 1 tablet by mouth at bedtime.     Historical Provider, MD   BP 119/75 mmHg  Pulse 90  Temp(Src) 98 F (36.7 C) (Oral)  Resp 18  Ht 5\' 10"  (1.778 m)  Wt 130 lb (58.968 kg)  BMI 18.65 kg/m2  SpO2 97%  LMP 02/11/2015 (Approximate) Physical Exam  Constitutional: She appears well-developed and well-nourished. No distress.  HENT:  Head: Normocephalic and atraumatic.  Nose: Right sinus exhibits no maxillary sinus tenderness and no frontal sinus tenderness. Left sinus exhibits no maxillary sinus tenderness and no frontal sinus tenderness.  Mouth/Throat: Mucous membranes are normal. Posterior oropharyngeal erythema present. No oropharyngeal exudate  or posterior oropharyngeal edema.  Eyes: Conjunctivae and EOM are normal. Right eye exhibits no discharge. Left eye exhibits no discharge.  Neck: Normal range of motion. Neck supple.  Cardiovascular: Normal rate, regular rhythm and normal heart sounds.   No leg swelling or tenderness. Negative Homan's sign.  Pulmonary/Chest: Effort normal. No respiratory distress. She has wheezes. She has no rales.  Decreased air movement  with inspiratory wheezing.   Abdominal: Soft. Bowel sounds are normal. She exhibits no distension. There is no tenderness.  Lymphadenopathy:    She has cervical adenopathy.  Neurological: She is alert.  Skin: Skin is warm and dry. She is not diaphoretic.  Nursing note and vitals reviewed.   ED Course  Procedures   DIAGNOSTIC STUDIES: Oxygen Saturation is 99% on RA, normal by my interpretation.    COORDINATION OF CARE: 11:07 AM Discussed treatment plan with pt at bedside and pt agreed to plan.  Labs Review Labs Reviewed - No data to display  Imaging Review Dg Chest 2 View  02/28/2015   CLINICAL DATA:  Nasal congestion and chills for 3 days  EXAM: CHEST  2 VIEW  COMPARISON:  None.  FINDINGS: Lungs are clear. Heart size and pulmonary vascularity are normal. No adenopathy. No pneumothorax. No bone lesions.  IMPRESSION: No edema or consolidation.   Electronically Signed   By: Lowella Grip III M.D.   On: 02/28/2015 12:34     EKG Interpretation   Date/Time:  Friday Feb 28 2015 11:35:37 EDT Ventricular Rate:  93 PR Interval:  154 QRS Duration: 72 QT Interval:  366 QTC Calculation: 455 R Axis:   100 Text Interpretation:  Normal sinus rhythm Possible Left atrial enlargement  Rightward axis Borderline ECG No previous tracing Confirmed by POLLINA   MD, CHRISTOPHER [phone]) on 02/28/2015 12:47:15 PM      Meds given in ED:  Medications  ondansetron (ZOFRAN-ODT) disintegrating tablet 4 mg (4 mg Oral Given 02/28/15 1122)  ibuprofen (ADVIL,MOTRIN) tablet 600 mg (600 mg Oral Given 02/28/15 1122)  predniSONE (DELTASONE) tablet 40 mg (40 mg Oral Given 02/28/15 1122)  albuterol (PROVENTIL HFA;VENTOLIN HFA) 108 (90 BASE) MCG/ACT inhaler 2 puff (2 puffs Inhalation Given 02/28/15 1122)    Discharge Medication List as of 02/28/2015 12:53 PM    START taking these medications   Details  predniSONE (DELTASONE) 20 MG tablet 2 tabs po daily x 4 days, Print          MDM   Final diagnoses:   URI (upper respiratory infection)  Smoking  Wheezing   Pt presenting with URI and chest congestion with smoking history and low grade fevers. Pt afebrile with tachycardia. Pt refused IV fluids and wants to orally hydrate. Lungs with decreased air movement throughout with wheezing worse on right. No respiratory distress. No hypoxia or tachypnea. Low risk wells and I doubt PE in setting of low grade fevers/cough/wheezing. EKG nonischemia. CXR without PNA. Pt likely with viral syndrome exacerbated by long standing smoking history. Pt given zofran and tolerating fluids in ED with resolution of tachycardia. Pt states breathing much better after albuterol inhaler.  Pt ambulated in ED with O2 stats of 100%. Pt to follow up with PCP for medications to help her stop smoking.   Discussed return precautions with patient. Discussed all results and patient verbalizes understanding and agrees with plan.  I personally performed the services described in this documentation, which was scribed in my presence. The recorded information has been reviewed and is accurate.   [place]  Daniel Nones, PA-C 02/28/15 [place], PA-C 02/28/15 [place], MD 03/01/15 1218

## 2015-02-28 NOTE — ED Notes (Signed)
Patient tolerated fluids fine.  Fluids with medications.

## 2015-02-28 NOTE — ED Notes (Signed)
Pt reports she started 3 days ago with sore throat, nasal congestion, body aches, chills, chest congestion, cough. States had N/V yesterday but none today.

## 2015-10-15 ENCOUNTER — Emergency Department (HOSPITAL_COMMUNITY)
Admission: EM | Admit: 2015-10-15 | Discharge: 2015-10-15 | Disposition: A | Discharge status: Home / Self Care | Payer: Medicaid Other | Attending: Emergency Medicine | Admitting: Emergency Medicine

## 2015-10-15 ENCOUNTER — Emergency Department (HOSPITAL_COMMUNITY): Payer: Medicaid Other

## 2015-10-15 ENCOUNTER — Encounter (HOSPITAL_COMMUNITY): Payer: Self-pay | Admitting: *Deleted

## 2015-10-15 DIAGNOSIS — R05 Cough: Secondary | ICD-10-CM

## 2015-10-15 DIAGNOSIS — B349 Viral infection, unspecified: Secondary | ICD-10-CM | POA: Insufficient documentation

## 2015-10-15 DIAGNOSIS — F17210 Nicotine dependence, cigarettes, uncomplicated: Secondary | ICD-10-CM | POA: Insufficient documentation

## 2015-10-15 DIAGNOSIS — Z8659 Personal history of other mental and behavioral disorders: Secondary | ICD-10-CM | POA: Diagnosis not present

## 2015-10-15 DIAGNOSIS — R059 Cough, unspecified: Secondary | ICD-10-CM

## 2015-10-15 DIAGNOSIS — Z8669 Personal history of other diseases of the nervous system and sense organs: Secondary | ICD-10-CM | POA: Diagnosis not present

## 2015-10-15 LAB — BASIC METABOLIC PANEL
ANION GAP: 10 (ref 5–15)
BUN: 10 mg/dL (ref 6–20)
CALCIUM: 9 mg/dL (ref 8.9–10.3)
CO2: 24 mmol/L (ref 22–32)
CREATININE: 0.73 mg/dL (ref 0.44–1.00)
Chloride: 108 mmol/L (ref 101–111)
Glucose, Bld: 128 mg/dL — ABNORMAL HIGH (ref 65–99)
Potassium: 3.7 mmol/L (ref 3.5–5.1)
SODIUM: 142 mmol/L (ref 135–145)

## 2015-10-15 LAB — CBC
HCT: 42 % (ref 36.0–46.0)
HEMOGLOBIN: 14.4 g/dL (ref 12.0–15.0)
MCH: 29.6 pg (ref 26.0–34.0)
MCHC: 34.3 g/dL (ref 30.0–36.0)
MCV: 86.2 fL (ref 78.0–100.0)
Platelets: 192 10*3/uL (ref 150–400)
RBC: 4.87 MIL/uL (ref 3.87–5.11)
RDW: 13.4 % (ref 11.5–15.5)
WBC: 6.3 10*3/uL (ref 4.0–10.5)

## 2015-10-15 MED ORDER — ALBUTEROL SULFATE HFA 108 (90 BASE) MCG/ACT IN AERS: 1.0000 | INHALATION_SPRAY | Freq: Four times a day (QID) | RESPIRATORY_TRACT | Status: DC | PRN

## 2015-10-15 MED ORDER — GUAIFENESIN ER 600 MG PO TB12: 600.0000 mg | ORAL_TABLET | Freq: Two times a day (BID) | ORAL | Status: DC

## 2015-10-15 MED ORDER — BENZONATATE 100 MG PO CAPS: 100.0000 mg | ORAL_CAPSULE | Freq: Three times a day (TID) | ORAL | Status: DC

## 2015-10-15 MED ORDER — IBUPROFEN 800 MG PO TABS
800.0000 mg | ORAL_TABLET | Freq: Three times a day (TID) | ORAL | Status: DC
Start: 2015-10-15 — End: 2016-07-26

## 2015-10-15 NOTE — ED Provider Notes (Signed)
CSN: [id number]     Arrival date & time 10/15/15  1828 History  By signing my name below, I, Starleen Arms, attest that this documentation has been prepared under the direction and in the presence of Wayland Baik, [place]. Electronically Signed: Starleen Arms ED Scribe. 10/15/2015. 7:13 PM.    Chief Complaint  Patient presents with  . Cough    The history is provided by the patient. No language interpreter was used.   HPI Comments: Kristine Mosley is a 29 y.o. female who presents to the Emergency Department complaining of a dry cough onset 4 days ago that became productive recently. She states she has had coughing fits that are so severe she feels like it is difficult to take a deep breath. Associated symptoms include one episode of post-tussive emesis, wheezing, SOB, sore throat, and lower, bilateral rib pain only present with cough.  The patient used Nyquil last night without relief.  Patient is a 0.5 ppd smoker.  She denies abdominal pain, fever, chills, nausea. Denies myalgias. Denies sick contacts. She states she had a similar illness last year and albuterol was helpful for her coughing fits.     Past Medical History  Diagnosis Date  . Depression   . Anxiety   . Insomnia    History reviewed. No pertinent past surgical history. Family History  Problem Relation Age of Onset  . Heart disease Mother   . Cancer Father    Social History  Substance Use Topics  . Smoking status: Current Every Day Smoker -- 0.25 packs/day for 10 years    Types: Cigarettes  . Smokeless tobacco: Never Used  . Alcohol Use: No   OB History    Gravida Para Term Preterm AB TAB SAB Ectopic Multiple Living   3 2 2  1  1   2      Review of Systems 10 Systems reviewed and all are negative for acute change except as noted in the HPI.  Allergies  Review of patient's allergies indicates no known allergies.  Home Medications   Prior to Admission medications   Medication Sig Start Date End Date Taking?  Authorizing Provider  medroxyPROGESTERone (DEPO-PROVERA) 150 MG/ML injection Inject 1 mL (150 mg total) into the muscle every 3 (three) months. 06/22/12   Lavonia Drafts, MD  predniSONE (DELTASONE) 20 MG tablet 2 tabs po daily x 4 days 02/28/15   Al Corpus, PA-C  Prenatal Vit-Fe Fumarate-FA (PRENATAL MULTIVITAMIN) TABS Take 1 tablet by mouth at bedtime.     Historical Provider, MD   BP 122/80 mmHg  Pulse 92  Temp(Src) 97.7 F (36.5 C) (Oral)  Resp 16  SpO2 97% Physical Exam  Constitutional: She is oriented to person, place, and time. She appears well-developed and well-nourished. No distress.  HENT:  Head: Normocephalic and atraumatic.  Mouth/Throat: Mucous membranes are normal. No posterior oropharyngeal edema or posterior oropharyngeal erythema.  Eyes: Conjunctivae and EOM are normal.  Neck: Neck supple. No tracheal deviation present.  Cardiovascular: Normal rate, regular rhythm and normal heart sounds.   Pulmonary/Chest: Effort normal and breath sounds normal. No respiratory distress. She has no wheezes. She exhibits no tenderness.  Pt is moving air well. No wheezes, crackles, or rhonchi.  Abdominal: Soft. Bowel sounds are normal. She exhibits no distension. There is no tenderness. There is no guarding.  Musculoskeletal: Normal range of motion.  Neurological: She is alert and oriented to person, place, and time.  Skin: Skin is warm and dry.  Psychiatric: She has a normal  mood and affect. Her behavior is normal.  Nursing note and vitals reviewed.   ED Course  Procedures (including critical care time)  DIAGNOSTIC STUDIES: Oxygen Saturation is 97% on RA, normal by my interpretation.    COORDINATION OF CARE:  7:31 PM Discussed treatment plan with patient at bedside.  Patient acknowledges and agrees with plan.    Labs Review Labs Reviewed  BASIC METABOLIC PANEL - Abnormal; Notable for the following:    Glucose, Bld 128 (*)    All other components within normal  limits  CBC    Imaging Review Dg Chest 2 View  10/15/2015  CLINICAL DATA:  Cough and congestion EXAM: CHEST - 2 VIEW COMPARISON:  02/28/2015 FINDINGS: The heart size and mediastinal contours are within normal limits. Both lungs are clear. The visualized skeletal structures are unremarkable. IMPRESSION: No active disease. Electronically Signed   By: Inez Catalina M.D.   On: 10/15/2015 19:28   I have personally reviewed and evaluated these images and lab results as part of my medical decision-making.   EKG Interpretation None      MDM   Final diagnoses:  Cough  Viral syndrome    Pt is afebrile, not tachycardic. She is nontoxic appearing. Her CXR is negative for infiltrate or other acute abnormality. Labs drawn in triage were unremarkable. I suspect likely viral etiology of pt's URI. Her lungs sound clear and she is moving air well. She is not hypoxic. However, given pt's report of coughing fits that result in SOB, I will give rx for albuterol. Will also give supportive meds including ibuprofen, mucinex, and tessalon. Resource guide given to establish PCP. ER return precautions given. Pt verbalized understanding.   I personally performed the services described in this documentation, which was scribed in my presence. The recorded information has been reviewed and is accurate.   Anne Ng, PA-C 10/15/15 2002  Ripley Fraise, MD 10/15/15 2118

## 2015-10-15 NOTE — ED Notes (Addendum)
Pt reports cough, congestion, and chest pain for several days. Pt states that pain occurs with coughing and deep breathing. Pt states that she pain started last night and other symptoms started on christmas eve.

## 2015-10-15 NOTE — Discharge Instructions (Signed)
You were seen in the ER today for cold symptoms. Your chest x-ray and bloodwork were unremarkable. Your symptoms are most likely due to a virus. I gave you several prescriptions to help with your symptoms. Please follow up with a primary care provider within one week. If you do not have one please call one of the clinics below to establish primary care. Return to the ER for new or worsening symptoms.   Take medications as prescribed. Return to the emergency room for worsening condition or new concerning symptoms. Follow up with your regular doctor. If you don't have a regular doctor use one of the numbers below to establish a primary care doctor.   Emergency Department Resource Guide 1) Find a Doctor and Pay Out of Pocket Although you won't have to find out who is covered by your insurance plan, it is a good idea to ask around and get recommendations. You will then need to call the office and see if the doctor you have chosen will accept you as a new patient and what types of options they offer for patients who are self-pay. Some doctors offer discounts or will set up payment plans for their patients who do not have insurance, but you will need to ask so you aren't surprised when you get to your appointment.  2) Contact Your Local Health Department Not all health departments have doctors that can see patients for sick visits, but many do, so it is worth a call to see if yours does. If you don't know where your local health department is, you can check in your phone book. The CDC also has a tool to help you locate your state's health department, and many state websites also have listings of all of their local health departments.  3) Find a [facility] If your illness is not likely to be very severe or complicated, you may want to try a walk in clinic. These are popping up all over the country in pharmacies, drugstores, and shopping centers. They're usually staffed by nurse practitioners or physician  assistants that have been trained to treat common illnesses and complaints. They're usually fairly quick and inexpensive. However, if you have serious medical issues or chronic medical problems, these are probably not your best option.  No Primary Care Doctor: - Call Health Connect at  [phone] - they can help you locate a primary care doctor that  accepts your insurance, provides certain services, etc. - Physician Referral [phone]  Emergency Department Resource Guide 1) Find a Doctor and Pay Out of Pocket Although you won't have to find out who is covered by your insurance plan, it is a good idea to ask around and get recommendations. You will then need to call the office and see if the doctor you have chosen will accept you as a new patient and what types of options they offer for patients who are self-pay. Some doctors offer discounts or will set up payment plans for their patients who do not have insurance, but you will need to ask so you aren't surprised when you get to your appointment.  2) Contact Your Local Health Department Not all health departments have doctors that can see patients for sick visits, but many do, so it is worth a call to see if yours does. If you don't know where your local health department is, you can check in your phone book. The CDC also has a tool to help you locate your state's health department, and many  state websites also have listings of all of their local health departments.  3) Find a [facility] If your illness is not likely to be very severe or complicated, you may want to try a walk in clinic. These are popping up all over the country in pharmacies, drugstores, and shopping centers. They're usually staffed by nurse practitioners or physician assistants that have been trained to treat common illnesses and complaints. They're usually fairly quick and inexpensive. However, if you have serious medical issues or chronic medical problems, these are  probably not your best option.  No Primary Care Doctor: - Call Health Connect at  [phone] - they can help you locate a primary care doctor that  accepts your insurance, provides certain services, etc. - Physician Referral Service- [phone]  Chronic Pain Problems: Organization         Address  Phone   Notes  [facility]  [phone] Patients need to be referred by their primary care doctor.   Medication Assistance: Organization         Address  Phone   Notes  [facility] Medication [facility] [address] [place], [place] [address] [phone] --Must be a resident of [facility] -- Must have NO insurance coverage whatsoever (no Medicaid/ Medicare, etc.) -- The pt. MUST have a primary care doctor that directs their care regularly and follows them in the community   [facility]  [phone]   [employer]  [phone]    Agencies that provide inexpensive medical care: Organization         Address  Phone   Notes  [facility]  [phone]   [facility]    [phone]   [facility] [place], [place] [address] [phone]   [place] [address], [place] [phone]   [facility]    [phone]   [facility]    [phone]   [place] [address], [place] Phone:  [phone], Fax:  [phone] Hours of Operation:  9 am - 6 pm, M-F.  Also accepts Medicaid/Medicare and self-pay.  [facility] for [place], [address], [place] Phone: [phone], Fax: [phone]. Hours of Operation:  8:30 am - 5:30 pm, M-F.  Also accepts Medicaid and self-pay.  [facility] [place] [address], [place] Phone: [phone]   [place], [place], [place] [phone], Ext. [phone] Mondays & Thursdays:  7-9 AM.  First 15 patients are seen on a first come, first serve basis.    [place] Providers:  Organization         Address  Phone   Notes  [facility] [address], [place] [phone] Also accepts self-pay patients.  [facility] [id number] [place], [place]  [phone]   [place], [address], [place] [phone]   [facility] [address], [place] [phone]   Lucianne Lei [address], [place]   [phone] Only accepts [place] [facility] [place] patients after they have their name applied to their card.   Self-Pay (no insurance) in [facility]:  [employer]  Phone   Notes  Sickle Cell Patients, [facility] [place] [phone]   [facility] [place] [phone]   [facility] [place]  [place], [address],  [phone]   [facility]/Dr. Osei-Bonsu  [address], [place] or [address], [place] [phone] Phone number for both [place] and [place] locations is the same.  [facility] [address] [place] [phone]   [facility] [address] [place] or [address] [phone]   [facility] [address], [place] [phone], phone; [phone], fax Sees patients 1st and 3rd Saturday of every month.  Must not qualify for public or private insurance (i.e. Medicaid, Medicare, [place] Health Choice, Veterans' Benefits)  Household income should be no more than 200% of the poverty level The clinic cannot treat you if you are pregnant or think you are pregnant  Sexually transmitted diseases are not treated at the clinic.

## 2016-05-02 ENCOUNTER — Emergency Department (HOSPITAL_COMMUNITY)
Admission: EM | Admit: 2016-05-02 | Discharge: 2016-05-03 | Disposition: A | Discharge status: Home / Self Care | Payer: Medicaid Other | Attending: Emergency Medicine | Admitting: Emergency Medicine

## 2016-05-02 ENCOUNTER — Encounter (HOSPITAL_COMMUNITY): Payer: Self-pay | Admitting: *Deleted

## 2016-05-02 DIAGNOSIS — Y929 Unspecified place or not applicable: Secondary | ICD-10-CM | POA: Insufficient documentation

## 2016-05-02 DIAGNOSIS — S3992XA Unspecified injury of lower back, initial encounter: Secondary | ICD-10-CM | POA: Diagnosis present

## 2016-05-02 DIAGNOSIS — S39012A Strain of muscle, fascia and tendon of lower back, initial encounter: Secondary | ICD-10-CM | POA: Diagnosis not present

## 2016-05-02 DIAGNOSIS — F329 Major depressive disorder, single episode, unspecified: Secondary | ICD-10-CM | POA: Diagnosis not present

## 2016-05-02 DIAGNOSIS — F17210 Nicotine dependence, cigarettes, uncomplicated: Secondary | ICD-10-CM | POA: Diagnosis not present

## 2016-05-02 DIAGNOSIS — Y999 Unspecified external cause status: Secondary | ICD-10-CM | POA: Insufficient documentation

## 2016-05-02 DIAGNOSIS — W182XXA Fall in (into) shower or empty bathtub, initial encounter: Secondary | ICD-10-CM | POA: Insufficient documentation

## 2016-05-02 DIAGNOSIS — Y939 Activity, unspecified: Secondary | ICD-10-CM | POA: Insufficient documentation

## 2016-05-02 LAB — URINALYSIS, ROUTINE W REFLEX MICROSCOPIC
BILIRUBIN URINE: NEGATIVE
Glucose, UA: NEGATIVE mg/dL
KETONES UR: NEGATIVE mg/dL
NITRITE: NEGATIVE
Protein, ur: NEGATIVE mg/dL
pH: 6 (ref 5.0–8.0)

## 2016-05-02 LAB — URINE MICROSCOPIC-ADD ON

## 2016-05-02 LAB — POC URINE PREG, ED: PREG TEST UR: NEGATIVE

## 2016-05-02 NOTE — ED Notes (Signed)
Pt c/o lower back pain after going to the pool yesterday. Pt also states he fell in the shower as well and today she bent over to pick up her keys and she felt a pop in her back.

## 2016-05-03 MED ORDER — NAPROXEN 250 MG PO TABS
500.0000 mg | ORAL_TABLET | Freq: Once | ORAL | Status: AC
  Administered 2016-05-03: 500 mg via ORAL
  Filled 2016-05-03: qty 2

## 2016-05-03 MED ORDER — NAPROXEN 500 MG PO TABS: 500.0000 mg | ORAL_TABLET | Freq: Two times a day (BID) | ORAL | Status: DC

## 2016-05-03 MED ORDER — METHOCARBAMOL 500 MG PO TABS
1000.0000 mg | ORAL_TABLET | Freq: Once | ORAL | Status: AC
  Administered 2016-05-03: 1000 mg via ORAL
  Filled 2016-05-03: qty 2

## 2016-05-03 MED ORDER — METHOCARBAMOL 500 MG PO TABS: 500.0000 mg | ORAL_TABLET | Freq: Three times a day (TID) | ORAL | Status: DC

## 2016-05-03 NOTE — Discharge Instructions (Signed)

## 2016-05-03 NOTE — ED Provider Notes (Signed)
CSN: [id number]     Arrival date & time 05/02/16  2310 History   First MD Initiated Contact with Patient 05/02/16 2314     Chief Complaint  Patient presents with  . Back Pain     (Consider location/radiation/quality/duration/timing/severity/associated sxs/prior Treatment) HPI Kristine Mosley is a 30 y.o. female who presents to the Emergency Department complaining of low back pain for one day.  She noticed pain after swimming yesterday and states the pain became worse today after falling against the shower wall and bending over to pick up her keys.  She describes a sharp pain to her right lower back that radiates into her buttocks and upper thigh.  Pain is worse with certain movements and improves with rest.  She took ibuprofen yesterday without relief.  She denies numbness or weakness of the LE's, abd pain, fever, urine or bowel changes.   Past Medical History  Diagnosis Date  . Depression   . Anxiety   . Insomnia    History reviewed. No pertinent past surgical history. Family History  Problem Relation Age of Onset  . Heart disease Mother   . Cancer Father    Social History  Substance Use Topics  . Smoking status: Current Every Day Smoker -- 0.25 packs/day for 10 years    Types: Cigarettes  . Smokeless tobacco: Never Used  . Alcohol Use: No   OB History    Gravida Para Term Preterm AB TAB SAB Ectopic Multiple Living   3 2 2  1  1   2      Review of Systems  Constitutional: Negative for fever.  Respiratory: Negative for shortness of breath.   Gastrointestinal: Negative for vomiting, abdominal pain and constipation.  Genitourinary: Negative for dysuria, hematuria, flank pain, decreased urine volume and difficulty urinating.  Musculoskeletal: Positive for back pain. Negative for joint swelling.  Skin: Negative for rash.  Neurological: Negative for weakness and numbness.  All other systems reviewed and are negative.     Allergies  Review of patient's allergies  indicates no known allergies.  Home Medications   Prior to Admission medications   Medication Sig Start Date End Date Taking? Authorizing Provider  albuterol (PROVENTIL HFA;VENTOLIN HFA) 108 (90 Base) MCG/ACT inhaler Inhale 1-2 puffs into the lungs every 6 (six) hours as needed for wheezing or shortness of breath. 10/15/15   Olivia Canter Sam, PA-C  benzonatate (TESSALON) 100 MG capsule Take 1 capsule (100 mg total) by mouth every 8 (eight) hours. 10/15/15   Olivia Canter Sam, PA-C  guaiFENesin (MUCINEX) 600 MG 12 hr tablet Take 1 tablet (600 mg total) by mouth 2 (two) times daily. 10/15/15   Olivia Canter Sam, PA-C  ibuprofen (ADVIL,MOTRIN) 800 MG tablet Take 1 tablet (800 mg total) by mouth 3 (three) times daily. 10/15/15   Olivia Canter Sam, PA-C  medroxyPROGESTERone (DEPO-PROVERA) 150 MG/ML injection Inject 1 mL (150 mg total) into the muscle every 3 (three) months. 06/22/12   Lavonia Drafts, MD  predniSONE (DELTASONE) 20 MG tablet 2 tabs po daily x 4 days 02/28/15   Al Corpus, PA-C  Prenatal Vit-Fe Fumarate-FA (PRENATAL MULTIVITAMIN) TABS Take 1 tablet by mouth at bedtime.     Historical Provider, MD   BP 147/101 mmHg  Pulse 97  Temp(Src) 98.2 F (36.8 C) (Oral)  Resp 16  Ht 5\' 10"  (1.778 m)  Wt 53.978 kg  BMI 17.07 kg/m2  SpO2 97%  LMP 04/13/2016 Physical Exam  Constitutional: She is oriented to person, place, and time. She  appears well-developed and well-nourished. No distress.  HENT:  Head: Normocephalic and atraumatic.  Neck: Normal range of motion. Neck supple.  Cardiovascular: Normal rate, regular rhythm, normal heart sounds and intact distal pulses.   No murmur heard. Pulmonary/Chest: Effort normal and breath sounds normal. No respiratory distress.  Abdominal: Soft. She exhibits no distension. There is no tenderness.  Musculoskeletal: She exhibits tenderness. She exhibits no edema.       Lumbar back: She exhibits tenderness and pain. She exhibits normal range of motion, no  swelling, no deformity, no laceration and normal pulse.  ttp of the lower lumbar spine and right paraspinal muscles.  No bony step offs.  DP pulses are brisk and symmetrical.  Distal sensation intact.  Pt has 5/5 strength against resistance of bilateral lower extremities.     Neurological: She is alert and oriented to person, place, and time. She has normal strength. No sensory deficit. She exhibits normal muscle tone. Coordination and gait normal.  Reflex Scores:      Patellar reflexes are 2+ on the right side and 2+ on the left side.      Achilles reflexes are 2+ on the right side and 2+ on the left side. Skin: Skin is warm and dry. No rash noted.  Nursing note and vitals reviewed.   ED Course  Procedures (including critical care time) Labs Review Labs Reviewed  URINALYSIS, ROUTINE W REFLEX MICROSCOPIC (NOT AT [facility]) - Abnormal; Notable for the following:    Specific Gravity, Urine >1.030 (*)    Hgb urine dipstick TRACE (*)    Leukocytes, UA TRACE (*)    All other components within normal limits  URINE MICROSCOPIC-ADD ON - Abnormal; Notable for the following:    Squamous Epithelial / LPF 0-5 (*)    Bacteria, UA FEW (*)    All other components within normal limits  URINE CULTURE  POC URINE PREG, ED    Imaging Review No results found. I have personally reviewed and evaluated these images and lab results as part of my medical decision-making.   EKG Interpretation None      MDM   Final diagnoses:  Lumbar strain, initial encounter    Pt ambulates with a steady gait.  No concerning sx's for emergent neuorlogical process.  Likely lumbar strain.  Pt agrees to symptomatic tx and PMD f/u if not improving.      Kem Parkinson, PA-C 05/03/16 [place], MD 05/03/16 [phone]

## 2016-05-04 LAB — URINE CULTURE

## 2016-06-08 ENCOUNTER — Encounter: Payer: Self-pay | Admitting: Obstetrics & Gynecology

## 2016-06-16 ENCOUNTER — Encounter: Payer: Self-pay | Admitting: Obstetrics & Gynecology

## 2016-06-16 ENCOUNTER — Ambulatory Visit (INDEPENDENT_AMBULATORY_CARE_PROVIDER_SITE_OTHER): Payer: Medicaid Other | Admitting: Obstetrics & Gynecology

## 2016-06-16 ENCOUNTER — Encounter: Payer: Self-pay | Admitting: *Deleted

## 2016-06-16 VITALS — BP 134/94 | HR 76 | Wt 127.3 lb

## 2016-06-16 DIAGNOSIS — Z3009 Encounter for other general counseling and advice on contraception: Secondary | ICD-10-CM

## 2016-06-16 NOTE — Progress Notes (Signed)
Pt is a 30 yo G2P2 LMP 05/23/2016. Pt is using the Nexplanon for contracepion and OCPs for bleeding.  Patient desires surgical management with bilateral tubal ligation.  The risks of surgery were discussed in detail with the patient including but not limited to: bleeding which may require transfusion or reoperation; infection which may require prolonged hospitalization or re-hospitalization and antibiotic therapy; injury to bowel, bladder, ureters and major vessels or other surrounding organs; need for additional procedures including laparotomy; thromboembolic phenomenon, incisional problems and other postoperative or anesthesia complications.  Patient was told that the likelihood that her condition and symptoms will be treated effectively with this surgical management was very high; the postoperative expectations were also discussed in detail. Failure rate of 3-01/999 discussed.  There is an increased risk of tubal or ectopic pregnancy should pregnancy occur. The patient also understands the alternative treatment options which were discussed in full. All questions were answered.  She was told that she will be contacted by our surgical scheduler regarding the time and date of her surgery; routine preoperative instructions of having nothing to eat or drink after midnight on the day prior to surgery and also coming to the hospital 1 1/2 hours prior to her time of surgery were also emphasized.  She was told she may be called for a preoperative appointment about a week prior to surgery and will be given further preoperative instructions at that visit. Printed patient education handouts about the procedure were given to the patient to review at home.  [name], M.D., Cherlynn June

## 2016-06-16 NOTE — Progress Notes (Signed)
BTL paperwork signed.  

## 2016-06-16 NOTE — Patient Instructions (Signed)
Laparoscopic Tubal Ligation Laparoscopic tubal ligation is a procedure that closes the fallopian tubes at a time other than right after childbirth. When the fallopian tubes are closed, the eggs that are released from the ovaries cannot enter the uterus, and sperm cannot reach the egg. Tubal ligation is also known as getting your "tubes tied." Tubal ligation is done so you will not be able to get pregnant or have a baby. Although this procedure may be undone (reversed), it should be considered permanent and irreversible. If you want to have future pregnancies, you should not have this procedure. LET [facility] CARE PROVIDER KNOW ABOUT:  Any allergies you have.  All medicines you are taking, including vitamins, herbs, eye drops, creams, and over-the-counter medicines. This includes any use of steroids, either by mouth or in cream form.  Previous problems you or members of your family have had with the use of anesthetics.  Any blood disorders you have.  Previous surgeries you have had.  Any medical conditions you may have.  Possibility of pregnancy, if this applies.  Any past pregnancies. RISKS AND COMPLICATIONS  Infection.  Bleeding.  Injury to surrounding organs.  Side effects from anesthetics.  Failure of the procedure.  Ectopic pregnancy.  Future regret about having the procedure done. BEFORE THE PROCEDURE  Ask your health care provider about:  Changing or stopping your regular medicines. This is especially important if you are taking diabetes medicines or blood thinners.  Taking medicines such as aspirin and ibuprofen. These medicines can thin your blood. Do not take these medicines before your procedure if your health care provider instructs you not to.  Follow instructions from your health care provider about eating and drinking restrictions.  Plan to have someone take you home after the procedure.  If you go home right after the procedure, plan to have someone  with you for 24 hours. PROCEDURE  You will be given one or more of the following:  A medicine that helps you relax (sedative).  A medicine that numbs the area (local anesthetic).  A medicine that makes you fall asleep (general anesthetic).  A medicine that is injected into an area of your body that numbs everything below the injection site (regional anesthetic).  If you have been given general anesthetic, a tube will be put down your throat to help you breathe.  Two small cuts (incisions) will be made in the lower abdominal area and near the belly button.  Your bladder may be emptied with a small tube (catheter).  Your abdomen will be inflated with a safe gas (carbon dioxide). This will help to give the surgeon room to operate and visualize, and it will help the surgeon to avoid other organs.  A thin, lighted tube (laparoscope) with a camera attached will be inserted into your abdomen through one of the incisions near the belly button. Other small instruments will be inserted through the other abdominal incision.  The fallopian tubes will be tied off or burned (cauterized), or they will be blocked with a clip, ring, or clamp. In many cases, a small portion in the center of each fallopian tube will also be removed.  After the fallopian tubes are blocked, the gas will be released from the abdomen.  The incisions will be closed with stitches (sutures).  A bandage (dressing) will be placed over the incisions. The procedure may vary among health care providers and hospitals. AFTER THE PROCEDURE  Your blood pressure, heart rate, breathing rate, and blood oxygen level  will be monitored often until the medicines you were given have worn off.  You will be given pain medicine as needed.  If you had general anesthetic, you may have some mild discomfort in your throat. This is from the breathing tube that was placed in your throat while you were sleeping.  You may experience discomfort in  the shoulder area from some trapped air between your liver and your diaphragm. This sensation is normal, and it will slowly go away on its own.  You will have some mild abdominal discomfort for 3--7 days.   This information is not intended to replace advice given to you by your health care provider. Make sure you discuss any questions you have with your health care provider.   Document Released: 01/10/2001 Document Revised: 02/18/2015 Document Reviewed: 01/15/2012 Elsevier Interactive Patient Education [employer].

## 2016-06-18 ENCOUNTER — Encounter (HOSPITAL_COMMUNITY): Payer: Self-pay | Admitting: *Deleted

## 2016-07-01 ENCOUNTER — Encounter (HOSPITAL_COMMUNITY): Payer: Self-pay | Admitting: *Deleted

## 2016-07-18 HISTORY — PX: TUBAL LIGATION: SHX77

## 2016-07-26 ENCOUNTER — Encounter (HOSPITAL_COMMUNITY): Payer: Self-pay | Admitting: *Deleted

## 2016-07-26 ENCOUNTER — Ambulatory Visit (HOSPITAL_COMMUNITY): Payer: Medicaid Other | Admitting: Certified Registered Nurse Anesthetist

## 2016-07-26 ENCOUNTER — Ambulatory Visit (HOSPITAL_COMMUNITY)
Admission: RE | Admit: 2016-07-26 | Discharge: 2016-07-26 | Disposition: A | Discharge status: Home / Self Care | Payer: Medicaid Other | Source: Ambulatory Visit | Attending: Obstetrics & Gynecology | Admitting: Obstetrics & Gynecology

## 2016-07-26 ENCOUNTER — Encounter (HOSPITAL_COMMUNITY)
Admission: RE | Disposition: A | Discharge status: Home / Self Care | Payer: Self-pay | Source: Ambulatory Visit | Attending: Obstetrics & Gynecology

## 2016-07-26 DIAGNOSIS — F17210 Nicotine dependence, cigarettes, uncomplicated: Secondary | ICD-10-CM | POA: Diagnosis not present

## 2016-07-26 DIAGNOSIS — Z302 Encounter for sterilization: Secondary | ICD-10-CM | POA: Diagnosis not present

## 2016-07-26 DIAGNOSIS — Z3009 Encounter for other general counseling and advice on contraception: Secondary | ICD-10-CM

## 2016-07-26 HISTORY — DX: Headache: R51

## 2016-07-26 HISTORY — DX: Headache, unspecified: R51.9

## 2016-07-26 HISTORY — PX: LAPAROSCOPIC TUBAL LIGATION: SHX1937

## 2016-07-26 LAB — CBC
HCT: 39.6 % (ref 36.0–46.0)
Hemoglobin: 13.8 g/dL (ref 12.0–15.0)
MCH: 29.2 pg (ref 26.0–34.0)
MCHC: 34.8 g/dL (ref 30.0–36.0)
MCV: 83.9 fL (ref 78.0–100.0)
PLATELETS: 209 10*3/uL (ref 150–400)
RBC: 4.72 MIL/uL (ref 3.87–5.11)
RDW: 13.6 % (ref 11.5–15.5)
WBC: 7.1 10*3/uL (ref 4.0–10.5)

## 2016-07-26 LAB — PREGNANCY, URINE: PREG TEST UR: NEGATIVE

## 2016-07-26 SURGERY — LIGATION, FALLOPIAN TUBE, LAPAROSCOPIC
Anesthesia: General | Site: Abdomen | Laterality: Bilateral

## 2016-07-26 MED ORDER — ROCURONIUM BROMIDE 100 MG/10ML IV SOLN
INTRAVENOUS | Status: AC
  Filled 2016-07-26: qty 1

## 2016-07-26 MED ORDER — LACTATED RINGERS IV SOLN
INTRAVENOUS | Status: DC
  Administered 2016-07-26 (×2): via INTRAVENOUS

## 2016-07-26 MED ORDER — IBUPROFEN 800 MG PO TABS: 800.0000 mg | ORAL_TABLET | Freq: Three times a day (TID) | ORAL | 0 refills | Status: DC | PRN

## 2016-07-26 MED ORDER — BUPIVACAINE HCL (PF) 0.5 % IJ SOLN
INTRAMUSCULAR | Status: AC
  Filled 2016-07-26: qty 30

## 2016-07-26 MED ORDER — FENTANYL CITRATE (PF) 100 MCG/2ML IJ SOLN
INTRAMUSCULAR | Status: DC
Start: 2016-07-26 — End: 2016-07-26
  Filled 2016-07-26: qty 2

## 2016-07-26 MED ORDER — LIDOCAINE HCL (CARDIAC) 20 MG/ML IV SOLN
INTRAVENOUS | Status: DC | PRN
  Administered 2016-07-26: 60 mg via INTRAVENOUS

## 2016-07-26 MED ORDER — SCOPOLAMINE 1 MG/3DAYS TD PT72
MEDICATED_PATCH | TRANSDERMAL | Status: AC
  Filled 2016-07-26: qty 1

## 2016-07-26 MED ORDER — LACTATED RINGERS IV SOLN: INTRAVENOUS | Status: DC

## 2016-07-26 MED ORDER — FENTANYL CITRATE (PF) 100 MCG/2ML IJ SOLN
INTRAMUSCULAR | Status: DC | PRN
Start: 2016-07-26 — End: 2016-07-26
  Administered 2016-07-26 (×2): 100 ug via INTRAVENOUS
  Administered 2016-07-26: 50 ug via INTRAVENOUS

## 2016-07-26 MED ORDER — ROCURONIUM BROMIDE 100 MG/10ML IV SOLN
INTRAVENOUS | Status: DC | PRN
  Administered 2016-07-26: 30 mg via INTRAVENOUS

## 2016-07-26 MED ORDER — MIDAZOLAM HCL 2 MG/2ML IJ SOLN
INTRAMUSCULAR | Status: DC | PRN
  Administered 2016-07-26: 2 mg via INTRAVENOUS

## 2016-07-26 MED ORDER — ONDANSETRON HCL 4 MG/2ML IJ SOLN
INTRAMUSCULAR | Status: DC | PRN
  Administered 2016-07-26: 4 mg via INTRAVENOUS

## 2016-07-26 MED ORDER — KETOROLAC TROMETHAMINE 30 MG/ML IJ SOLN
INTRAMUSCULAR | Status: AC
  Filled 2016-07-26: qty 1

## 2016-07-26 MED ORDER — BUPIVACAINE HCL 0.5 % IJ SOLN
INTRAMUSCULAR | Status: DC | PRN
  Administered 2016-07-26: 10 mL

## 2016-07-26 MED ORDER — FENTANYL CITRATE (PF) 100 MCG/2ML IJ SOLN
25.0000 ug | INTRAMUSCULAR | Status: DC | PRN
  Administered 2016-07-26: 25 ug via INTRAVENOUS

## 2016-07-26 MED ORDER — PROPOFOL 10 MG/ML IV BOLUS
INTRAVENOUS | Status: AC
  Filled 2016-07-26: qty 20

## 2016-07-26 MED ORDER — ONDANSETRON HCL 4 MG/2ML IJ SOLN
INTRAMUSCULAR | Status: AC
  Filled 2016-07-26: qty 2

## 2016-07-26 MED ORDER — DEXAMETHASONE SODIUM PHOSPHATE 10 MG/ML IJ SOLN
INTRAMUSCULAR | Status: DC | PRN
  Administered 2016-07-26: 4 mg via INTRAVENOUS

## 2016-07-26 MED ORDER — GLYCOPYRROLATE 0.2 MG/ML IJ SOLN
INTRAMUSCULAR | Status: DC | PRN
  Administered 2016-07-26: 0.4 mg via INTRAVENOUS
  Administered 2016-07-26: 0.2 mg via INTRAVENOUS

## 2016-07-26 MED ORDER — NEOSTIGMINE METHYLSULFATE 10 MG/10ML IV SOLN
INTRAVENOUS | Status: DC | PRN
  Administered 2016-07-26: 3 mg via INTRAVENOUS

## 2016-07-26 MED ORDER — ONDANSETRON HCL 4 MG/2ML IJ SOLN: 4.0000 mg | Freq: Once | INTRAMUSCULAR | Status: DC | PRN

## 2016-07-26 MED ORDER — NEOSTIGMINE METHYLSULFATE 10 MG/10ML IV SOLN
INTRAVENOUS | Status: AC
  Filled 2016-07-26: qty 1

## 2016-07-26 MED ORDER — LIDOCAINE HCL (CARDIAC) 20 MG/ML IV SOLN
INTRAVENOUS | Status: AC
  Filled 2016-07-26: qty 5

## 2016-07-26 MED ORDER — MIDAZOLAM HCL 2 MG/2ML IJ SOLN
INTRAMUSCULAR | Status: AC
  Filled 2016-07-26: qty 2

## 2016-07-26 MED ORDER — GLYCOPYRROLATE 0.2 MG/ML IJ SOLN
INTRAMUSCULAR | Status: AC
  Filled 2016-07-26: qty 3

## 2016-07-26 MED ORDER — SCOPOLAMINE 1 MG/3DAYS TD PT72
1.0000 | MEDICATED_PATCH | Freq: Once | TRANSDERMAL | Status: DC
  Administered 2016-07-26: 1.5 mg via TRANSDERMAL

## 2016-07-26 MED ORDER — FENTANYL CITRATE (PF) 250 MCG/5ML IJ SOLN
INTRAMUSCULAR | Status: AC
Start: 2016-07-26 — End: 2016-07-26
  Filled 2016-07-26: qty 5

## 2016-07-26 MED ORDER — KETOROLAC TROMETHAMINE 30 MG/ML IJ SOLN
INTRAMUSCULAR | Status: DC | PRN
  Administered 2016-07-26: 30 mg via INTRAVENOUS

## 2016-07-26 SURGICAL SUPPLY — 25 items
CATH ROBINSON RED A/P 16FR (CATHETERS) ×3 IMPLANT
CLIP FILSHIE TUBAL LIGA STRL (Clip) ×6 IMPLANT
CLOTH BEACON ORANGE TIMEOUT ST (SAFETY) ×3 IMPLANT
DRSG OPSITE POSTOP 3X4 (GAUZE/BANDAGES/DRESSINGS) ×3 IMPLANT
DURAPREP 26ML APPLICATOR (WOUND CARE) ×3 IMPLANT
GLOVE BIO SURGEON STRL SZ7 (GLOVE) ×3 IMPLANT
GLOVE BIOGEL PI IND STRL 7.0 (GLOVE) ×2 IMPLANT
GLOVE BIOGEL PI INDICATOR 7.0 (GLOVE) ×4
GOWN STRL REUS W/TWL LRG LVL3 (GOWN DISPOSABLE) ×6 IMPLANT
GOWN STRL REUS W/TWL XL LVL3 (GOWN DISPOSABLE) ×3 IMPLANT
MANIPULATOR UTERINE 4.5 ZUMI (MISCELLANEOUS) ×3 IMPLANT
NEEDLE INSUFFLATION 120MM (ENDOMECHANICALS) ×3 IMPLANT
PACK LAPAROSCOPY BASIN (CUSTOM PROCEDURE TRAY) ×3 IMPLANT
PACK TRENDGUARD 450 HYBRID PRO (MISCELLANEOUS) IMPLANT
PACK TRENDGUARD 600 HYBRD PROC (MISCELLANEOUS) IMPLANT
PROTECTOR NERVE ULNAR (MISCELLANEOUS) ×6 IMPLANT
SUT VIC AB 3-0 PS2 18 (SUTURE) ×3 IMPLANT
SUT VICRYL 0 UR6 27IN ABS (SUTURE) ×3 IMPLANT
TOWEL OR 17X24 6PK STRL BLUE (TOWEL DISPOSABLE) ×6 IMPLANT
TRENDGUARD 450 HYBRID PRO PACK (MISCELLANEOUS)
TRENDGUARD 600 HYBRID PROC PK (MISCELLANEOUS)
TROCAR XCEL DIL TIP R 11M (ENDOMECHANICALS) ×3 IMPLANT
TROCAR XCEL NON-BLD 5MMX100MML (ENDOMECHANICALS) IMPLANT
WARMER LAPAROSCOPE (MISCELLANEOUS) ×3 IMPLANT
WATER STERILE IRR 1000ML POUR (IV SOLUTION) ×3 IMPLANT

## 2016-07-26 NOTE — Anesthesia Preprocedure Evaluation (Addendum)
Anesthesia Evaluation  Patient identified by MRN, date of birth, ID band Patient awake    Reviewed: Allergy & Precautions, NPO status , Patient's Chart, lab work & pertinent test results  History of Anesthesia Complications Negative for: history of anesthetic complications  Airway Mallampati: II  TM Distance: >3 FB Neck ROM: Full    Dental no notable dental hx. (+) Dental Advisory Given, Poor Dentition, Chipped, Missing   Pulmonary Current Smoker,    Pulmonary exam normal breath sounds clear to auscultation       Cardiovascular negative cardio ROS Normal cardiovascular exam Rhythm:Regular Rate:Normal     Neuro/Psych  Headaches, PSYCHIATRIC DISORDERS Anxiety Depression    GI/Hepatic negative GI ROS, Neg liver ROS,   Endo/Other  negative endocrine ROS  Renal/GU negative Renal ROS  negative genitourinary   Musculoskeletal negative musculoskeletal ROS (+)   Abdominal   Peds negative pediatric ROS (+)  Hematology negative hematology ROS (+)   Anesthesia Other Findings   Reproductive/Obstetrics negative OB ROS                            Anesthesia Physical Anesthesia Plan  ASA: II  Anesthesia Plan: General   Post-op Pain Management:    Induction: Intravenous  Airway Management Planned: Oral ETT  Additional Equipment:   Intra-op Plan:   Post-operative Plan: Extubation in OR  Informed Consent: I have reviewed the patients History and Physical, chart, labs and discussed the procedure including the risks, benefits and alternatives for the proposed anesthesia with the patient or authorized representative who has indicated his/her understanding and acceptance.   Dental advisory given  Plan Discussed with: CRNA  Anesthesia Plan Comments:         Anesthesia Quick Evaluation

## 2016-07-26 NOTE — Anesthesia Postprocedure Evaluation (Signed)
Anesthesia Post Note  Patient: [place]  Procedure(s) Performed: Procedure(s) (LRB): LAPAROSCOPIC TUBAL LIGATION (Bilateral)  Patient location during evaluation: PACU Anesthesia Type: General Level of consciousness: awake and alert Pain management: pain level controlled Vital Signs Assessment: post-procedure vital signs reviewed and stable Respiratory status: spontaneous breathing, nonlabored ventilation, respiratory function stable and patient connected to nasal cannula oxygen Cardiovascular status: blood pressure returned to baseline and stable Postop Assessment: no signs of nausea or vomiting Anesthetic complications: no     Last Vitals:  Vitals:   07/26/16 1330 07/26/16 1345  BP: (!) 135/98 (!) 133/93  Pulse: 63 (!) 47  Resp: 13 16  Temp: 36.4 C     Last Pain:  Vitals:   07/26/16 1330  TempSrc:   PainSc: 4    Pain Goal: Patients Stated Pain Goal: 4 (07/26/16 1115)               Aldyn Toon JENNETTE

## 2016-07-26 NOTE — Transfer of Care (Signed)
Immediate Anesthesia Transfer of Care Note  Patient: Kristine Mosley  Procedure(s) Performed: Procedure(s): LAPAROSCOPIC TUBAL LIGATION (Bilateral)  Patient Location: PACU  Anesthesia Type:General  Level of Consciousness: awake, alert  and oriented  Airway & Oxygen Therapy: Patient Spontanous Breathing and Patient connected to nasal cannula oxygen  Post-op Assessment: Report given to RN, Post -op Vital signs reviewed and stable and Patient moving all extremities X 4  Post vital signs: Reviewed and stable  Last Vitals:  Vitals:   07/26/16 1115  BP: (!) 130/99  Pulse: 75  Resp: 18  Temp: 36.8 C    Last Pain:  Vitals:   07/26/16 1115  TempSrc: Oral  PainSc: 2       Patients Stated Pain Goal: 4 ([id number])  Complications: No apparent anesthesia complications

## 2016-07-26 NOTE — Discharge Instructions (Addendum)
DISCHARGE INSTRUCTIONS: Laparoscopic tubal ligation  **You may take ibuprofen after 7:11 pm tonight  The following instructions have been prepared to help you care for yourself upon your return home today.  Wound care:  Do not get the incision wet for the first 24 hours. The incision should be kept clean and dry.  The Band-Aids or dressings may be removed the day after surgery.  Should the incision become sore, red, and swollen after the first week, check with your doctor.  Personal hygiene:  Shower the day after your procedure.  Activity and limitations:  Do NOT drive or operate any equipment today.  Do NOT lift anything more than 15 pounds for 2-3 weeks after surgery.  Do NOT rest in bed all day.  Walking is encouraged. Walk each day, starting slowly with 5-minute walks 3 or 4 times a day. Slowly increase the length of your walks.  Walk up and down stairs slowly.  Do NOT do strenuous activities, such as golfing, playing tennis, bowling, running, biking, weight lifting, gardening, mowing, or vacuuming for 2-4 weeks. Ask your doctor when it is okay to start.  Diet: Eat a light meal as desired this evening. You may resume your usual diet tomorrow.  Return to work: This is dependent on the type of work you do. For the most part you can return to a desk job within a week of surgery. If you are more active at work, please discuss this with your doctor.  What to expect after your surgery: You may have a slight burning sensation when you urinate on the first day. You may have a very small amount of blood in the urine. Expect to have a small amount of vaginal discharge/light bleeding for 1-2 weeks. It is not unusual to have abdominal soreness and bruising for up to 2 weeks. You may be tired and need more rest for about 1 week. You may experience shoulder pain for 24-72 hours. Lying flat in bed may relieve it.  Call your doctor for any of the following:  Develop a fever of 100.4 or  greater  Inability to urinate 6 hours after discharge from hospital  Severe pain not relieved by pain medications  Persistent of heavy bleeding at incision site  Redness or swelling around incision site after a week  Increasing nausea or vomiting  Patient Signature________________________________________  Nurse Signature_________________________________________  Support person's signature____________________________________

## 2016-07-26 NOTE — Anesthesia Procedure Notes (Signed)
Procedure Name: Intubation Date/Time: 07/26/2016 12:46 PM Performed by: Lauretta Grill Pre-anesthesia Checklist: Patient identified, Emergency Drugs available, Suction available and Patient being monitored Patient Re-evaluated:Patient Re-evaluated prior to inductionOxygen Delivery Method: Circle system utilized Preoxygenation: Pre-oxygenation with 100% oxygen Intubation Type: IV induction Ventilation: Mask ventilation without difficulty Laryngoscope Size: Miller and 2 Grade View: Grade I Tube type: Oral Tube size: 7.0 mm Number of attempts: 1 Placement Confirmation: ETT inserted through vocal cords under direct vision,  positive ETCO2,  breath sounds checked- equal and bilateral and CO2 detector Secured at: 22 cm Tube secured with: Tape Dental Injury: Teeth and Oropharynx as per pre-operative assessment

## 2016-07-26 NOTE — Brief Op Note (Signed)
07/26/2016  1:13 PM  PATIENT:  Hazle Coca  30 y.o. female  PRE-OPERATIVE DIAGNOSIS:  cpt [phone] - Undesired fertility  POST-OPERATIVE DIAGNOSIS:  UNDESIRED FERTILITY  PROCEDURE:  Procedure(s): LAPAROSCOPIC TUBAL LIGATION (Bilateral)  SURGEON:  Surgeon(s) and Role:    * Lavonia Drafts, MD - Primary  ANESTHESIA:   local and general  EBL:  Total I/O In: 1000 [I.V.:1000] Out: 305 [Urine:300; Blood:5]  BLOOD ADMINISTERED:none  DRAINS: none   LOCAL MEDICATIONS USED:  MARCAINE     SPECIMEN:  No Specimen  DISPOSITION OF SPECIMEN:  PATHOLOGY  COUNTS:  YES  TOURNIQUET:  * No tourniquets in log *  DICTATION: .Note written in EPIC  PLAN OF CARE: Discharge to home after PACU  PATIENT DISPOSITION:  PACU - hemodynamically stable.   Delay start of Pharmacological VTE agent (>24hrs) due to surgical blood loss or risk of bleeding: not applicable  Complications: none immediate  Roald Lukacs L. Harraway-Smith, M.D., Cherlynn June

## 2016-07-26 NOTE — Op Note (Signed)
07/26/2016  1:13 PM  PATIENT:  Kristine Mosley  30 y.o. female  PRE-OPERATIVE DIAGNOSIS:  cpt [phone] - Undesired fertility  POST-OPERATIVE DIAGNOSIS:  UNDESIRED FERTILITY  PROCEDURE:  Procedure(s): LAPAROSCOPIC TUBAL LIGATION (Bilateral)  SURGEON:  Surgeon(s) and Role:    * Lavonia Drafts, MD - Primary  ANESTHESIA:   local and general  EBL:  Total I/O In: 1000 [I.V.:1000] Out: 305 [Urine:300; Blood:5]  BLOOD ADMINISTERED:none  DRAINS: none   LOCAL MEDICATIONS USED:  MARCAINE     SPECIMEN:  No Specimen  DISPOSITION OF SPECIMEN:  PATHOLOGY  COUNTS:  YES  TOURNIQUET:  * No tourniquets in log *  DICTATION: .Note written in EPIC  PLAN OF CARE: Discharge to home after PACU  PATIENT DISPOSITION:  PACU - hemodynamically stable.   Delay start of Pharmacological VTE agent (>24hrs) due to surgical blood loss or risk of bleeding: not applicable  Complications: none immediate  INDICATIONS: 30 y.o. [id number]  with undesired fertility, desires permanent sterilization. Other reversible forms of contraception were discussed with patient; she declines all other modalities.  Risks of procedure discussed with patient including permanence of method, bleeding, infection, injury to surrounding organs and need for additional procedures including laparotomy, risk of regret.  Failure risk of 0.5-1% with increased risk of ectopic gestation if pregnancy occurs was also discussed with patient.      FINDINGS:  Normal uterus, tubes, and ovaries.  TECHNIQUE:  The patient was taken to the operating room where general anesthesia was obtained without difficulty.  She was then placed in the dorsal lithotomy position and prepared and draped in sterile fashion.  After an adequate timeout was performed, a bivalved speculum was then placed in the patient's vagina, and the anterior lip of cervix grasped with the single-tooth tenaculum.  The uterine manipulator was then advanced into the uterus.  The  speculum was removed from the vagina.  Attention was then turned to the patient's abdomen where a 11-mm skin incision was made in the umbilical fold.  The Veress needle was carefully introduced into the peritoneal cavity through the abdominal wall.  Intraperitoneal placement was confirmed by drop in intraabdominal pressure with insufflation of carbon dioxide gas.  Adequate pneumoperitoneum was obtained, and the 11-mm trocar and sleeve were then advanced without difficulty into the abdomen where intraabdominal placement was confirmed by the operative laparoscope. A survey of the patient's pelvis and abdomen revealed entirely normal anatomy.  The fallopian tubes were observed and found to be normal in appearance. The Filshie clip applicator was placed through the operative port, and a Filshie clip was placed on the right fallopian tube ,about 2 cm from the cornual attachment, with care given to incorporate the underlying mesosalpinx.  A similar process was carried out on the contralateral side allowing for bilateral tubal sterilization.   Good hemostasis was noted overall. The instruments were then removed from the patient's abdomen and the fascial incision was repaired with 0 Vicryl, and the skin was closed with 3-0 vicryl and Dermabond. 10cc of .5% Marcaine was injected into the incision.  The uterine manipulator and the tenaculum were removed from the vagina without complications. The patient tolerated the procedure well.  Sponge, lap, and needle counts were correct times two.  The patient was then taken to the recovery room awake, extubated and in stable condition.  Nazario Russom L. Harraway-Smith, M.D., Cherlynn June

## 2016-07-26 NOTE — H&P (Signed)
Preoperative History and Physical  Kristine Mosley is a 30 y.o. [id number] here for surgical management of undesired fertility.   Proposed surgery: laparoscopic bilateral tubal ligation  Past Medical History:  Diagnosis Date  . Anxiety    no meds currently  . Depression    no meds currently  . Headache    otc med prn  . Insomnia   . SVD (spontaneous vaginal delivery)    x 2   Past Surgical History:  Procedure Laterality Date  . WISDOM TOOTH EXTRACTION     OB History    Gravida Para Term Preterm AB Living   3 2 2   1 2    SAB TAB Ectopic Multiple Live Births   1       2     Patient denies any cervical dysplasia or STIs. Prescriptions Prior to Admission  Medication Sig Dispense Refill Last Dose  . levonorgestrel-ethinyl estradiol (AVIANE,ALESSE,LESSINA) 0.1-20 MG-MCG tablet Take 1 tablet by mouth daily.     Marland Kitchen albuterol (PROVENTIL HFA;VENTOLIN HFA) 108 (90 Base) MCG/ACT inhaler Inhale 1-2 puffs into the lungs every 6 (six) hours as needed for wheezing or shortness of breath. 1 Inhaler 0 Taking  . benzonatate (TESSALON) 100 MG capsule Take 1 capsule (100 mg total) by mouth every 8 (eight) hours. (Patient not taking: Reported on 06/16/2016) 21 capsule 0 Not Taking  . guaiFENesin (MUCINEX) 600 MG 12 hr tablet Take 1 tablet (600 mg total) by mouth 2 (two) times daily. (Patient not taking: Reported on 06/16/2016) 20 tablet 0 Not Taking  . ibuprofen (ADVIL,MOTRIN) 800 MG tablet Take 1 tablet (800 mg total) by mouth 3 (three) times daily. (Patient not taking: Reported on 07/12/2016) 21 tablet 0 Not Taking at Unknown time  . medroxyPROGESTERone (DEPO-PROVERA) 150 MG/ML injection Inject 1 mL (150 mg total) into the muscle every 3 (three) months. (Patient not taking: Reported on 06/16/2016) 1 mL 0 Not Taking  . methocarbamol (ROBAXIN) 500 MG tablet Take 1 tablet (500 mg total) by mouth 3 (three) times daily. (Patient not taking: Reported on 06/16/2016) 21 tablet 0 Not Taking  . naproxen (NAPROSYN)  500 MG tablet Take 1 tablet (500 mg total) by mouth 2 (two) times daily with a meal. (Patient not taking: Reported on 06/16/2016) 20 tablet 0 Not Taking  . predniSONE (DELTASONE) 20 MG tablet 2 tabs po daily x 4 days (Patient not taking: Reported on 06/16/2016) 8 tablet 0 Not Taking    No Known Allergies Social History:   reports that she has been smoking Cigarettes.  She has a 2.50 pack-year smoking history. She has never used smokeless tobacco. She reports that she does not drink alcohol or use drugs. Family History  Problem Relation Age of Onset  . Heart disease Mother   . Cancer Father     Review of Systems: Noncontributory  PHYSICAL EXAM: Height 5\' 10"  (1.778 m), weight 126 lb (57.2 kg), last menstrual period 06/25/2016. General appearance - alert, well appearing, and in no distress Chest - clear to auscultation, no wheezes, rales or rhonchi, symmetric air entry Heart - normal rate and regular rhythm Abdomen - soft, nontender, nondistended, no masses or organomegaly Pelvic - examination not indicated Extremities - peripheral pulses normal, no pedal edema, no clubbing or cyanosis  Labs: CBC    Component Value Date/Time   WBC 7.1 07/26/2016 1055   RBC 4.72 07/26/2016 1055   HGB 13.8 07/26/2016 1055   HCT 39.6 07/26/2016 1055   PLT 209 07/26/2016 1055  MCV 83.9 07/26/2016 1055   MCH 29.2 07/26/2016 1055   MCHC 34.8 07/26/2016 1055   RDW 13.6 07/26/2016 1055   LYMPHSABS 0.8 05/21/2012 0229   MONOABS 0.8 05/21/2012 0229   EOSABS 0.1 05/21/2012 0229   BASOSABS 0.0 05/21/2012 0229    Assessment: consult for sterilization  Plan: Patient will undergo surgical management with laparoscopic bilateral tubal ligation.   The risks of surgery were discussed in detail with the patient including but not limited to: bleeding which may require transfusion or reoperation; infection which may require antibiotics; injury to surrounding organs which may involve bowel, bladder, ureters ; need  for additional procedures including laparoscopy or laparotomy; thromboembolic phenomenon, surgical site problems and other postoperative/anesthesia complications. Likelihood of success in alleviating the patient's condition was discussed. Routine postoperative instructions will be reviewed with the patient and her family in detail after surgery.  The patient concurred with the proposed plan, giving informed written consent for the surgery.  Patient has been NPO since last night she will remain NPO for procedure.  Anesthesia and OR aware.  Preoperative prophylactic antibiotics and SCDs ordered on call to the OR.  To OR when ready.  Lavert Matousek L. Harraway-Smith, M.D., [facility] 07/26/2016 10:58 AM

## 2016-07-27 ENCOUNTER — Encounter (HOSPITAL_COMMUNITY): Payer: Self-pay | Admitting: Obstetrics & Gynecology

## 2016-07-28 ENCOUNTER — Encounter (HOSPITAL_COMMUNITY): Payer: Self-pay | Admitting: Obstetrics & Gynecology

## 2016-08-23 ENCOUNTER — Encounter: Payer: Self-pay | Admitting: *Deleted

## 2016-08-25 ENCOUNTER — Encounter: Payer: Self-pay | Admitting: Obstetrics & Gynecology

## 2016-08-25 ENCOUNTER — Ambulatory Visit (INDEPENDENT_AMBULATORY_CARE_PROVIDER_SITE_OTHER): Payer: Medicaid Other | Admitting: Obstetrics & Gynecology

## 2016-08-25 VITALS — BP 130/98 | HR 87 | Wt 128.9 lb

## 2016-08-25 DIAGNOSIS — Z9889 Other specified postprocedural states: Secondary | ICD-10-CM

## 2016-08-25 NOTE — Progress Notes (Signed)
History:  30 y.o. [id number] here today for post op check from lap BTL 07/26/2016.  Pt denies problems. She denies pain. She is voiding and has normal bowel function.    The following portions of the patient's history were reviewed and updated as appropriate: allergies, current medications, past family history, past medical history, past social history, past surgical history and problem list.  Review of Systems:  Pertinent items are noted in HPI.   Objective:  Physical Exam Blood pressure (!) 130/98, pulse 87, weight 128 lb 14.4 oz (58.5 kg), last menstrual period 08/11/2016. Gen: NAD Abd: Soft, nontender and nondistended. Suture knot noted at incision site. This was trimmed.   Assessment & Plan:  4 weeks post op check- doing well  Rec f/u in 1 year.  Return to full active   [place]. Harraway-Smith, M.D., [facility]

## 2016-08-25 NOTE — Patient Instructions (Signed)
Laparoscopic Tubal Ligation, Care After Refer to this sheet in the next few weeks. These instructions provide you with information about caring for yourself after your procedure. Your health care provider may also give you more specific instructions. Your treatment has been planned according to current medical practices, but problems sometimes occur. Call your health care provider if you have any problems or questions after your procedure. WHAT TO EXPECT AFTER THE PROCEDURE After your procedure, it is common to have:  Sore throat.  Soreness at the incision site.  Mild cramping.  Tiredness.  Mild nausea or vomiting.  Shoulder pain. HOME CARE INSTRUCTIONS  Rest for the remainder of the day.  Take medicines only as directed by your health care provider. These include over-the-counter medicines and prescription medicines. Do not take aspirin, which can cause bleeding.  Over the next few days, gradually return to your normal activities and your normal diet.  Avoid sexual intercourse for 2 weeks or as directed by your health care provider.  Do not use tampons, and do not douche.  Do not drive or operate heavy machinery while taking pain medicine.  Do not lift anything that is heavier than 5 lb (2.3 kg) for 2 weeks or as directed by your health care provider.  Do not take baths. Take showers only. Ask your health care provider when you can start taking baths.  Take your temperature twice each day and write it down.  Try to have help for your household needs for the first 7-10 days.  There are many different ways to close and cover an incision, including stitches (sutures), skin glue, and adhesive strips. Follow instructions from your health care provider about:  Incision care.  Bandage (dressing) changes and removal.  Incision closure removal.  Check your incision area every day for signs of infection. Watch for:  Redness, swelling, or pain.  Fluid, blood, or pus.  Keep  all follow-up visits as directed by your health care provider. SEEK MEDICAL CARE IF:  You have redness, swelling, or increasing pain in your incision area.  You have fluid, blood, or pus coming from your incision for longer than 1 day.  You notice a bad smell coming from your incision or your dressing.  The edges of your incision break open after the sutures have been removed.  Your pain does not decrease after 2-3 days.  You have a rash.  You repeatedly become dizzy or light-headed.  You have a reaction to your medicine.  Your pain medicine is not helping.  You are constipated. SEEK IMMEDIATE MEDICAL CARE IF:  You have a fever.  You faint.  You have increasing pain in your abdomen.  You have severe pain in one or both of your shoulders.  You have bleeding or drainage from your suture sites or your vagina after surgery.  You have shortness of breath or have difficulty breathing.  You have chest pain or leg pain.  You have ongoing nausea, vomiting, or diarrhea.   This information is not intended to replace advice given to you by your health care provider. Make sure you discuss any questions you have with your health care provider.   Document Released: 04/23/2005 Document Revised: 02/18/2015 Document Reviewed: 01/15/2012 Elsevier Interactive Patient Education [employer].

## 2017-06-20 ENCOUNTER — Emergency Department (HOSPITAL_COMMUNITY): Payer: Medicaid Other

## 2017-06-20 ENCOUNTER — Emergency Department (HOSPITAL_COMMUNITY)
Admission: EM | Admit: 2017-06-20 | Discharge: 2017-06-20 | Disposition: A | Discharge status: Home / Self Care | Payer: Medicaid Other | Attending: Emergency Medicine | Admitting: Emergency Medicine

## 2017-06-20 ENCOUNTER — Encounter (HOSPITAL_COMMUNITY): Payer: Self-pay

## 2017-06-20 DIAGNOSIS — F17210 Nicotine dependence, cigarettes, uncomplicated: Secondary | ICD-10-CM | POA: Insufficient documentation

## 2017-06-20 DIAGNOSIS — N200 Calculus of kidney: Secondary | ICD-10-CM | POA: Insufficient documentation

## 2017-06-20 DIAGNOSIS — R109 Unspecified abdominal pain: Secondary | ICD-10-CM | POA: Diagnosis present

## 2017-06-20 LAB — URINALYSIS, ROUTINE W REFLEX MICROSCOPIC
Bilirubin Urine: NEGATIVE
GLUCOSE, UA: NEGATIVE mg/dL
Ketones, ur: NEGATIVE mg/dL
NITRITE: NEGATIVE
PH: 6 (ref 5.0–8.0)
PROTEIN: 100 mg/dL — AB
Specific Gravity, Urine: 1.009 (ref 1.005–1.030)

## 2017-06-20 LAB — I-STAT CHEM 8, ED
BUN: 12 mg/dL (ref 6–20)
CALCIUM ION: 1.19 mmol/L (ref 1.15–1.40)
CREATININE: 0.8 mg/dL (ref 0.44–1.00)
Chloride: 104 mmol/L (ref 101–111)
GLUCOSE: 80 mg/dL (ref 65–99)
HCT: 41 % (ref 36.0–46.0)
Hemoglobin: 13.9 g/dL (ref 12.0–15.0)
Potassium: 3.9 mmol/L (ref 3.5–5.1)
SODIUM: 140 mmol/L (ref 135–145)
TCO2: 23 mmol/L (ref 22–32)

## 2017-06-20 LAB — CBC
HEMATOCRIT: 39.1 % (ref 36.0–46.0)
HEMOGLOBIN: 13.1 g/dL (ref 12.0–15.0)
MCH: 27.5 pg (ref 26.0–34.0)
MCHC: 33.5 g/dL (ref 30.0–36.0)
MCV: 82.1 fL (ref 78.0–100.0)
Platelets: 208 10*3/uL (ref 150–400)
RBC: 4.76 MIL/uL (ref 3.87–5.11)
RDW: 13.9 % (ref 11.5–15.5)
WBC: 12.8 10*3/uL — ABNORMAL HIGH (ref 4.0–10.5)

## 2017-06-20 LAB — POC URINE PREG, ED: Preg Test, Ur: NEGATIVE

## 2017-06-20 MED ORDER — OXYCODONE-ACETAMINOPHEN 5-325 MG PO TABS
1.0000 | ORAL_TABLET | ORAL | Status: DC | PRN
  Administered 2017-06-20: 1 via ORAL

## 2017-06-20 MED ORDER — ONDANSETRON 4 MG PO TBDP
4.0000 mg | ORAL_TABLET | Freq: Once | ORAL | Status: AC
  Administered 2017-06-20: 4 mg via ORAL

## 2017-06-20 MED ORDER — SODIUM CHLORIDE 0.9 % IV BOLUS (SEPSIS)
500.0000 mL | Freq: Once | INTRAVENOUS | Status: AC
  Administered 2017-06-20: 500 mL via INTRAVENOUS

## 2017-06-20 MED ORDER — ONDANSETRON 4 MG PO TBDP
ORAL_TABLET | ORAL | Status: AC
  Filled 2017-06-20: qty 1

## 2017-06-20 MED ORDER — OXYCODONE-ACETAMINOPHEN 5-325 MG PO TABS
ORAL_TABLET | ORAL | Status: AC
  Filled 2017-06-20: qty 1

## 2017-06-20 NOTE — ED Notes (Signed)
When brought back to room, pt said she thinks she passed a kidney stone while using the bathroom in the waiting area. She said her pain went from an 11 to a 4. "I feel amazing right now"

## 2017-06-20 NOTE — ED Notes (Signed)
Patient transported to CT 

## 2017-06-20 NOTE — Discharge Instructions (Signed)
You can call the number on your discharge paperwork to get established with primary care.   No other kidney stones were seen today on her CT. It is important to stay hydrated to reduce the risk of having another kidney stone in the future.   If you develop any new or worsening symptoms, including, fever, chills, severe right-sided pain, or difficulty urinating, please return to the Emergency Department for re-evaluation.

## 2017-06-20 NOTE — ED Triage Notes (Signed)
Pt reports right flank pain since Saturday with difficulty urinating and nausea. PT reports no hx of kidney stones

## 2017-06-20 NOTE — ED Provider Notes (Signed)
[facility] DEPT Provider Note   CSN: [id number] Arrival date & time: 06/20/17  1213     History   Chief Complaint Chief Complaint  Patient presents with  . Flank Pain    HPI Kristine Mosley is a 31 y.o. female who presents to the emergency department with a chief complaint of right flank pain 3 days with associated nausea, chills, and urinary hesitancy. She denies fever, chills, emesis, or diarrhea. She has treated her symptoms at home and anti-inflammatories without improvement. She reports the right-sided pain was constant and worsening until she passed a kidney stone in the waiting room of the emergency department, which improved her pain from an 11 to a 4. She has no history of nephrolithiasis.   The history is provided by the patient. No language interpreter was used.    Past Medical History:  Diagnosis Date  . Anxiety    no meds currently  . Depression    no meds currently  . Headache    otc med prn  . Insomnia   . SVD (spontaneous vaginal delivery)    x 2    Patient Active Problem List   Diagnosis Date Noted  . Sterilization consult 07/26/2016  . Encounter for sterilization 07/26/2016    Past Surgical History:  Procedure Laterality Date  . LAPAROSCOPIC TUBAL LIGATION Bilateral 07/26/2016   Procedure: LAPAROSCOPIC TUBAL LIGATION;  Surgeon: Lavonia Drafts, MD;  Location: [facility];  Service: Gynecology;  Laterality: Bilateral;  . WISDOM TOOTH EXTRACTION      OB History    Gravida Para Term Preterm AB Living   3 2 2   1 2    SAB TAB Ectopic Multiple Live Births   1       2       Home Medications    Prior to Admission medications   Not on File    Family History Family History  Problem Relation Age of Onset  . Heart disease Mother   . Cancer Father     Social History Social History  Substance Use Topics  . Smoking status: Current Every Day Smoker    Packs/day: 0.25    Years: 10.00    Types: Cigarettes  . Smokeless tobacco: Never  Used  . Alcohol use No     Allergies   Patient has no known allergies.   Review of Systems Review of Systems  Constitutional: Positive for chills. Negative for activity change and fever.  Respiratory: Negative for shortness of breath.   Cardiovascular: Negative for chest pain.  Gastrointestinal: Negative for abdominal pain, diarrhea, nausea and vomiting.  Genitourinary: Positive for flank pain. Negative for dysuria, hematuria, urgency, vaginal bleeding, vaginal discharge and vaginal pain.       Hesitancy  Musculoskeletal: Negative for back pain.  Skin: Negative for rash.     Physical Exam Updated Vital Signs BP 112/83   Pulse 80   Temp 98.6 F (37 C)   Resp 16   Ht 5\' 10"  (1.778 m)   Wt 53.5 kg (118 lb)   SpO2 100%   BMI 16.93 kg/m   Physical Exam  Constitutional: No distress.  HENT:  Head: Normocephalic.  Eyes: Conjunctivae are normal.  Neck: Neck supple.  Cardiovascular: Normal rate, regular rhythm and normal heart sounds.  Exam reveals no gallop and no friction rub.   No murmur heard. Pulmonary/Chest: Effort normal. No respiratory distress. She has no wheezes. She has no rales.  Abdominal: Soft. She exhibits no distension. There is tenderness. There  is guarding. There is no rebound.  Right-sided CVA tenderness. Moderate tenderness to palpation with guarding over the right flank and suprapubic area. No rebound. No left-sided CVA tenderness.  Neurological: She is alert.  Skin: Skin is warm. No rash noted. She is not diaphoretic.  Psychiatric: Her behavior is normal.  Nursing note and vitals reviewed.  ED Treatments / Results  Labs (all labs ordered are listed, but only abnormal results are displayed) Labs Reviewed  URINALYSIS, ROUTINE W REFLEX MICROSCOPIC - Abnormal; Notable for the following:       Result Value   APPearance CLOUDY (*)    Hgb urine dipstick LARGE (*)    Protein, ur 100 (*)    Leukocytes, UA LARGE (*)    Bacteria, UA FEW (*)    Squamous  Epithelial / LPF 0-5 (*)    All other components within normal limits  CBC - Abnormal; Notable for the following:    WBC 12.8 (*)    All other components within normal limits  URINE CULTURE  POC URINE PREG, ED  I-STAT CHEM 8, ED    EKG  EKG Interpretation None       Radiology Ct Renal Stone Study  Result Date: 06/20/2017 CLINICAL DATA:  Right flank pain since Saturday. Passed stone today. EXAM: CT ABDOMEN AND PELVIS WITHOUT CONTRAST TECHNIQUE: Multidetector CT imaging of the abdomen and pelvis was performed following the standard protocol without IV contrast. COMPARISON:  None. FINDINGS: Lower chest: No acute abnormality. Hepatobiliary: No focal liver abnormality is seen. No gallstones, gallbladder wall thickening, or biliary dilatation. Pancreas: Unremarkable. No pancreatic ductal dilatation or surrounding inflammatory changes. Spleen: Normal in size without focal abnormality. Adrenals/Urinary Tract: Mild right hydronephrosis is identified. No focal stone is identified in bilateral kidneys or bilateral collecting systems. The bladder is normal. Stomach/Bowel: Stomach is within normal limits. Appendix appears normal. No evidence of bowel wall thickening, distention, or inflammatory changes. Vascular/Lymphatic: No significant vascular findings are present. No enlarged abdominal or pelvic lymph nodes. Reproductive: Uterus and bilateral adnexa are unremarkable. Bilateral tubal ligation clips are noted. Other: No abdominal wall hernia or abnormality. No abdominopelvic ascites. Musculoskeletal: No acute or significant osseous findings. IMPRESSION: Mild right hydronephrosis without focal obstructing stone identified. This consistent with patient's history of right flank pain and recently passed stone. Electronically Signed   By: Abelardo Diesel M.D.   On: 06/20/2017 16:37    Procedures Procedures (including critical care time)  Medications Ordered in ED Medications  oxyCODONE-acetaminophen  (PERCOCET/ROXICET) 5-325 MG per tablet 1 tablet (1 tablet Oral Given 06/20/17 1238)  ondansetron (ZOFRAN-ODT) 4 MG disintegrating tablet (not administered)  oxyCODONE-acetaminophen (PERCOCET/ROXICET) 5-325 MG per tablet (not administered)  ondansetron (ZOFRAN-ODT) disintegrating tablet 4 mg (4 mg Oral Given 06/20/17 1238)  sodium chloride 0.9 % bolus 500 mL (0 mLs Intravenous Stopped 06/20/17 1723)     Initial Impression / Assessment and Plan / ED Course  I have reviewed the triage vital signs and the nursing notes.  Pertinent labs & imaging results that were available during my care of the patient were reviewed by me and considered in my medical decision making (see chart for details).     31 year old female presenting with right-sided flank pain 3 days. Afebrile no tachycardia. The patient passed a kidney stone in the waiting room of the emergency department. Pain controlled with Percocet prior to passing the stone. CT stone study negative for additional stones; mild right hydronephrosis with obstruction. IV fluids given in the emergency department to help  facilitate hydration. Will d/c the patient to home with PCP follow up. She has no complaints at this time. Strict return precautions given. No acute distress. The patient safe for discharge at this time.  Final Clinical Impressions(s) / ED Diagnoses   Final diagnoses:  Nephrolithiasis    New Prescriptions Current Discharge Medication List       Joanne Gavel, PA-C 06/20/17 1725    Orlie Dakin, MD 06/20/17 1734

## 2017-06-22 LAB — URINE CULTURE: Special Requests: NORMAL

## 2017-11-12 ENCOUNTER — Inpatient Hospital Stay (HOSPITAL_COMMUNITY)
Admission: EM | Admit: 2017-11-12 | Discharge: 2017-11-24 | DRG: 957 | Disposition: A | Discharge status: Rehabilitation Facility | Payer: Medicaid Other | Attending: General Surgery | Admitting: General Surgery

## 2017-11-12 ENCOUNTER — Emergency Department (HOSPITAL_COMMUNITY): Payer: Medicaid Other

## 2017-11-12 ENCOUNTER — Inpatient Hospital Stay (HOSPITAL_COMMUNITY): Payer: Medicaid Other

## 2017-11-12 DIAGNOSIS — I70209 Unspecified atherosclerosis of native arteries of extremities, unspecified extremity: Secondary | ICD-10-CM | POA: Diagnosis present

## 2017-11-12 DIAGNOSIS — S32591A Other specified fracture of right pubis, initial encounter for closed fracture: Secondary | ICD-10-CM | POA: Diagnosis present

## 2017-11-12 DIAGNOSIS — D696 Thrombocytopenia, unspecified: Secondary | ICD-10-CM | POA: Diagnosis not present

## 2017-11-12 DIAGNOSIS — S82201D Unspecified fracture of shaft of right tibia, subsequent encounter for closed fracture with routine healing: Secondary | ICD-10-CM | POA: Diagnosis not present

## 2017-11-12 DIAGNOSIS — S36892A Contusion of other intra-abdominal organs, initial encounter: Secondary | ICD-10-CM | POA: Diagnosis present

## 2017-11-12 DIAGNOSIS — E876 Hypokalemia: Secondary | ICD-10-CM | POA: Diagnosis not present

## 2017-11-12 DIAGNOSIS — F1010 Alcohol abuse, uncomplicated: Secondary | ICD-10-CM | POA: Diagnosis not present

## 2017-11-12 DIAGNOSIS — F17210 Nicotine dependence, cigarettes, uncomplicated: Secondary | ICD-10-CM | POA: Diagnosis present

## 2017-11-12 DIAGNOSIS — S82201A Unspecified fracture of shaft of right tibia, initial encounter for closed fracture: Secondary | ICD-10-CM | POA: Diagnosis present

## 2017-11-12 DIAGNOSIS — D473 Essential (hemorrhagic) thrombocythemia: Secondary | ICD-10-CM | POA: Diagnosis not present

## 2017-11-12 DIAGNOSIS — S82251A Displaced comminuted fracture of shaft of right tibia, initial encounter for closed fracture: Principal | ICD-10-CM | POA: Diagnosis present

## 2017-11-12 DIAGNOSIS — S0101XA Laceration without foreign body of scalp, initial encounter: Secondary | ICD-10-CM | POA: Diagnosis present

## 2017-11-12 DIAGNOSIS — S069X3D Unspecified intracranial injury with loss of consciousness of 1 hour to 5 hours 59 minutes, subsequent encounter: Secondary | ICD-10-CM | POA: Diagnosis not present

## 2017-11-12 DIAGNOSIS — S82144A Nondisplaced bicondylar fracture of right tibia, initial encounter for closed fracture: Secondary | ICD-10-CM | POA: Diagnosis present

## 2017-11-12 DIAGNOSIS — J9601 Acute respiratory failure with hypoxia: Secondary | ICD-10-CM | POA: Diagnosis not present

## 2017-11-12 DIAGNOSIS — Z781 Physical restraint status: Secondary | ICD-10-CM

## 2017-11-12 DIAGNOSIS — D72829 Elevated white blood cell count, unspecified: Secondary | ICD-10-CM | POA: Diagnosis not present

## 2017-11-12 DIAGNOSIS — S22089A Unspecified fracture of T11-T12 vertebra, initial encounter for closed fracture: Secondary | ICD-10-CM | POA: Diagnosis present

## 2017-11-12 DIAGNOSIS — J9811 Atelectasis: Secondary | ICD-10-CM | POA: Diagnosis present

## 2017-11-12 DIAGNOSIS — J690 Pneumonitis due to inhalation of food and vomit: Secondary | ICD-10-CM | POA: Diagnosis not present

## 2017-11-12 DIAGNOSIS — S32401D Unspecified fracture of right acetabulum, subsequent encounter for fracture with routine healing: Secondary | ICD-10-CM | POA: Diagnosis not present

## 2017-11-12 DIAGNOSIS — F10239 Alcohol dependence with withdrawal, unspecified: Secondary | ICD-10-CM | POA: Diagnosis not present

## 2017-11-12 DIAGNOSIS — Z720 Tobacco use: Secondary | ICD-10-CM

## 2017-11-12 DIAGNOSIS — M2540 Effusion, unspecified joint: Secondary | ICD-10-CM

## 2017-11-12 DIAGNOSIS — S22089S Unspecified fracture of T11-T12 vertebra, sequela: Secondary | ICD-10-CM | POA: Diagnosis not present

## 2017-11-12 DIAGNOSIS — R402112 Coma scale, eyes open, never, at arrival to emergency department: Secondary | ICD-10-CM | POA: Diagnosis present

## 2017-11-12 DIAGNOSIS — S82451A Displaced comminuted fracture of shaft of right fibula, initial encounter for closed fracture: Secondary | ICD-10-CM | POA: Diagnosis present

## 2017-11-12 DIAGNOSIS — D62 Acute posthemorrhagic anemia: Secondary | ICD-10-CM | POA: Diagnosis not present

## 2017-11-12 DIAGNOSIS — S32424A Nondisplaced fracture of posterior wall of right acetabulum, initial encounter for closed fracture: Secondary | ICD-10-CM

## 2017-11-12 DIAGNOSIS — T17990A Other foreign object in respiratory tract, part unspecified in causing asphyxiation, initial encounter: Secondary | ICD-10-CM | POA: Diagnosis not present

## 2017-11-12 DIAGNOSIS — S82401D Unspecified fracture of shaft of right fibula, subsequent encounter for closed fracture with routine healing: Secondary | ICD-10-CM | POA: Diagnosis not present

## 2017-11-12 DIAGNOSIS — S329XXA Fracture of unspecified parts of lumbosacral spine and pelvis, initial encounter for closed fracture: Secondary | ICD-10-CM | POA: Diagnosis present

## 2017-11-12 DIAGNOSIS — Z23 Encounter for immunization: Secondary | ICD-10-CM | POA: Diagnosis not present

## 2017-11-12 DIAGNOSIS — E8770 Fluid overload, unspecified: Secondary | ICD-10-CM | POA: Diagnosis not present

## 2017-11-12 DIAGNOSIS — S82141D Displaced bicondylar fracture of right tibia, subsequent encounter for closed fracture with routine healing: Secondary | ICD-10-CM | POA: Diagnosis not present

## 2017-11-12 DIAGNOSIS — R52 Pain, unspecified: Secondary | ICD-10-CM

## 2017-11-12 DIAGNOSIS — R443 Hallucinations, unspecified: Secondary | ICD-10-CM | POA: Diagnosis not present

## 2017-11-12 DIAGNOSIS — S32424S Nondisplaced fracture of posterior wall of right acetabulum, sequela: Secondary | ICD-10-CM | POA: Diagnosis not present

## 2017-11-12 DIAGNOSIS — S069X9S Unspecified intracranial injury with loss of consciousness of unspecified duration, sequela: Secondary | ICD-10-CM | POA: Diagnosis not present

## 2017-11-12 DIAGNOSIS — Y906 Blood alcohol level of 120-199 mg/100 ml: Secondary | ICD-10-CM | POA: Diagnosis present

## 2017-11-12 DIAGNOSIS — R402232 Coma scale, best verbal response, inappropriate words, at arrival to emergency department: Secondary | ICD-10-CM | POA: Diagnosis present

## 2017-11-12 DIAGNOSIS — S060X9A Concussion with loss of consciousness of unspecified duration, initial encounter: Secondary | ICD-10-CM | POA: Diagnosis present

## 2017-11-12 DIAGNOSIS — Z978 Presence of other specified devices: Secondary | ICD-10-CM

## 2017-11-12 DIAGNOSIS — S82401A Unspecified fracture of shaft of right fibula, initial encounter for closed fracture: Secondary | ICD-10-CM

## 2017-11-12 DIAGNOSIS — T1490XA Injury, unspecified, initial encounter: Secondary | ICD-10-CM | POA: Diagnosis not present

## 2017-11-12 DIAGNOSIS — S82141S Displaced bicondylar fracture of right tibia, sequela: Secondary | ICD-10-CM | POA: Diagnosis not present

## 2017-11-12 DIAGNOSIS — S299XXA Unspecified injury of thorax, initial encounter: Secondary | ICD-10-CM

## 2017-11-12 DIAGNOSIS — T148XXA Other injury of unspecified body region, initial encounter: Secondary | ICD-10-CM

## 2017-11-12 DIAGNOSIS — S32421A Displaced fracture of posterior wall of right acetabulum, initial encounter for closed fracture: Secondary | ICD-10-CM | POA: Diagnosis present

## 2017-11-12 DIAGNOSIS — S82201S Unspecified fracture of shaft of right tibia, sequela: Secondary | ICD-10-CM | POA: Diagnosis not present

## 2017-11-12 DIAGNOSIS — S82141A Displaced bicondylar fracture of right tibia, initial encounter for closed fracture: Secondary | ICD-10-CM | POA: Diagnosis not present

## 2017-11-12 DIAGNOSIS — S32424D Nondisplaced fracture of posterior wall of right acetabulum, subsequent encounter for fracture with routine healing: Secondary | ICD-10-CM

## 2017-11-12 DIAGNOSIS — J9690 Respiratory failure, unspecified, unspecified whether with hypoxia or hypercapnia: Secondary | ICD-10-CM

## 2017-11-12 DIAGNOSIS — K117 Disturbances of salivary secretion: Secondary | ICD-10-CM

## 2017-11-12 DIAGNOSIS — S82401S Unspecified fracture of shaft of right fibula, sequela: Secondary | ICD-10-CM | POA: Diagnosis not present

## 2017-11-12 DIAGNOSIS — M25462 Effusion, left knee: Secondary | ICD-10-CM | POA: Diagnosis present

## 2017-11-12 DIAGNOSIS — R402352 Coma scale, best motor response, localizes pain, at arrival to emergency department: Secondary | ICD-10-CM | POA: Diagnosis present

## 2017-11-12 DIAGNOSIS — S8411XS Injury of peroneal nerve at lower leg level, right leg, sequela: Secondary | ICD-10-CM | POA: Diagnosis not present

## 2017-11-12 DIAGNOSIS — I998 Other disorder of circulatory system: Secondary | ICD-10-CM | POA: Diagnosis not present

## 2017-11-12 DIAGNOSIS — L03115 Cellulitis of right lower limb: Secondary | ICD-10-CM | POA: Diagnosis not present

## 2017-11-12 DIAGNOSIS — Z419 Encounter for procedure for purposes other than remedying health state, unspecified: Secondary | ICD-10-CM

## 2017-11-12 DIAGNOSIS — G8918 Other acute postprocedural pain: Secondary | ICD-10-CM | POA: Diagnosis not present

## 2017-11-12 HISTORY — DX: Personal history of urinary calculi: Z87.442

## 2017-11-12 HISTORY — DX: Gastro-esophageal reflux disease without esophagitis: K21.9

## 2017-11-12 LAB — URINALYSIS, ROUTINE W REFLEX MICROSCOPIC
Bacteria, UA: NONE SEEN
Bilirubin Urine: NEGATIVE
GLUCOSE, UA: 50 mg/dL — AB
Ketones, ur: NEGATIVE mg/dL
Leukocytes, UA: NEGATIVE
NITRITE: NEGATIVE
PROTEIN: NEGATIVE mg/dL
Squamous Epithelial / LPF: NONE SEEN
pH: 5 (ref 5.0–8.0)

## 2017-11-12 LAB — I-STAT CG4 LACTIC ACID, ED: Lactic Acid, Venous: 3.26 mmol/L (ref 0.5–1.9)

## 2017-11-12 LAB — I-STAT ARTERIAL BLOOD GAS, ED
Acid-base deficit: 10 mmol/L — ABNORMAL HIGH (ref 0.0–2.0)
BICARBONATE: 16.3 mmol/L — AB (ref 20.0–28.0)
O2 Saturation: 100 %
TCO2: 17 mmol/L — AB (ref 22–32)
pCO2 arterial: 35.5 mmHg (ref 32.0–48.0)
pH, Arterial: 7.266 — ABNORMAL LOW (ref 7.350–7.450)
pO2, Arterial: 310 mmHg — ABNORMAL HIGH (ref 83.0–108.0)

## 2017-11-12 LAB — I-STAT CHEM 8, ED
BUN: 10 mg/dL (ref 6–20)
CALCIUM ION: 0.83 mmol/L — AB (ref 1.15–1.40)
CHLORIDE: 118 mmol/L — AB (ref 101–111)
Creatinine, Ser: 0.7 mg/dL (ref 0.44–1.00)
Glucose, Bld: 105 mg/dL — ABNORMAL HIGH (ref 65–99)
HEMATOCRIT: 27 % — AB (ref 36.0–46.0)
Hemoglobin: 9.2 g/dL — ABNORMAL LOW (ref 12.0–15.0)
POTASSIUM: 3.5 mmol/L (ref 3.5–5.1)
SODIUM: 148 mmol/L — AB (ref 135–145)
TCO2: 15 mmol/L — ABNORMAL LOW (ref 22–32)

## 2017-11-12 LAB — COMPREHENSIVE METABOLIC PANEL
ALBUMIN: 3 g/dL — AB (ref 3.5–5.0)
ALK PHOS: 52 U/L (ref 38–126)
ALT: 68 U/L — AB (ref 14–54)
ANION GAP: 12 (ref 5–15)
AST: 130 U/L — AB (ref 15–41)
BILIRUBIN TOTAL: 0.5 mg/dL (ref 0.3–1.2)
BUN: 10 mg/dL (ref 6–20)
CALCIUM: 7 mg/dL — AB (ref 8.9–10.3)
CO2: 14 mmol/L — AB (ref 22–32)
CREATININE: 0.78 mg/dL (ref 0.44–1.00)
Chloride: 117 mmol/L — ABNORMAL HIGH (ref 101–111)
GFR calc Af Amer: >60 mL/min (ref >60)
GFR calc non Af Amer: >60 mL/min (ref >60)
GLUCOSE: 134 mg/dL — AB (ref 65–99)
Potassium: 3.3 mmol/L — ABNORMAL LOW (ref 3.5–5.1)
SODIUM: 143 mmol/L (ref 135–145)
TOTAL PROTEIN: 5.1 g/dL — AB (ref 6.5–8.1)

## 2017-11-12 LAB — BPAM FFP
BLOOD PRODUCT EXPIRATION DATE: 201901272359
Blood Product Expiration Date: 201901272359
ISSUE DATE / TIME: 201901262024
ISSUE DATE / TIME: 201901262024
UNIT TYPE AND RH: 6200
Unit Type and Rh: 6200

## 2017-11-12 LAB — PREPARE FRESH FROZEN PLASMA
Unit division: 0
Unit division: 0

## 2017-11-12 LAB — CBC
HCT: 35.1 % — ABNORMAL LOW (ref 36.0–46.0)
Hemoglobin: 11.4 g/dL — ABNORMAL LOW (ref 12.0–15.0)
MCH: 27.6 pg (ref 26.0–34.0)
MCHC: 32.5 g/dL (ref 30.0–36.0)
MCV: 85 fL (ref 78.0–100.0)
PLATELETS: 185 10*3/uL (ref 150–400)
RBC: 4.13 MIL/uL (ref 3.87–5.11)
RDW: 14.9 % (ref 11.5–15.5)
WBC: 26.6 10*3/uL — ABNORMAL HIGH (ref 4.0–10.5)

## 2017-11-12 LAB — PROTIME-INR
INR: 1.39
PROTHROMBIN TIME: 16.9 s — AB (ref 11.4–15.2)

## 2017-11-12 LAB — CDS SEROLOGY

## 2017-11-12 MED ORDER — PROPOFOL 1000 MG/100ML IV EMUL
INTRAVENOUS | Status: AC
  Administered 2017-11-12: 20 ug/kg/min via INTRAVENOUS
  Filled 2017-11-12: qty 100

## 2017-11-12 MED ORDER — MIDAZOLAM HCL 2 MG/2ML IJ SOLN
2.0000 mg | Freq: Once | INTRAMUSCULAR | Status: AC
  Administered 2017-11-12: 2 mg via INTRAVENOUS

## 2017-11-12 MED ORDER — FENTANYL CITRATE (PF) 100 MCG/2ML IJ SOLN
50.0000 ug | Freq: Once | INTRAMUSCULAR | Status: AC
  Administered 2017-11-12: 50 ug via INTRAVENOUS

## 2017-11-12 MED ORDER — TETANUS-DIPHTH-ACELL PERTUSSIS 5-2.5-18.5 LF-MCG/0.5 IM SUSP
INTRAMUSCULAR | Status: AC
  Filled 2017-11-12: qty 0.5

## 2017-11-12 MED ORDER — FENTANYL CITRATE (PF) 100 MCG/2ML IJ SOLN
INTRAMUSCULAR | Status: AC
  Filled 2017-11-12: qty 2

## 2017-11-12 MED ORDER — TETANUS-DIPHTH-ACELL PERTUSSIS 5-2.5-18.5 LF-MCG/0.5 IM SUSP
0.5000 mL | Freq: Once | INTRAMUSCULAR | Status: AC
  Administered 2017-11-12: 0.5 mL via INTRAMUSCULAR

## 2017-11-12 MED ORDER — ROCURONIUM BROMIDE 50 MG/5ML IV SOLN
60.0000 mg | Freq: Once | INTRAVENOUS | Status: AC
  Administered 2017-11-12: 60 mg via INTRAVENOUS

## 2017-11-12 MED ORDER — IOPAMIDOL (ISOVUE-300) INJECTION 61%
INTRAVENOUS | Status: AC
  Administered 2017-11-12: 100 mL
  Filled 2017-11-12: qty 100

## 2017-11-12 MED ORDER — SODIUM CHLORIDE 0.9 % IV SOLN
INTRAVENOUS | Status: AC | PRN
  Administered 2017-11-12: 1000 mL via INTRAVENOUS

## 2017-11-12 MED ORDER — ETOMIDATE 2 MG/ML IV SOLN
20.0000 mg | Freq: Once | INTRAVENOUS | Status: AC
  Administered 2017-11-12: 20 mg via INTRAVENOUS

## 2017-11-12 MED ORDER — FENTANYL CITRATE (PF) 100 MCG/2ML IJ SOLN
100.0000 ug | Freq: Once | INTRAMUSCULAR | Status: AC
  Administered 2017-11-12: 100 ug via INTRAVENOUS

## 2017-11-12 MED ORDER — LIDOCAINE HCL 2 % IJ SOLN
INTRAMUSCULAR | Status: AC | PRN
  Administered 2017-11-12: 100 mg

## 2017-11-12 MED ORDER — MIDAZOLAM HCL 2 MG/2ML IJ SOLN
INTRAMUSCULAR | Status: AC
  Filled 2017-11-12: qty 2
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
## 2017-11-12 NOTE — Progress Notes (Signed)
Orthopedic Tech Progress Note Patient Details:  Kristine Mosley [birth year] [id number]  Ortho Devices Type of Ortho Device: Ace wrap, Post (long leg) splint, Stirrup splint Ortho Device/Splint Location: RLE Ortho Device/Splint Interventions: Ordered, Application   Post Interventions Patient Tolerated: Well Instructions Provided: Care of device   Braulio Bosch 11/12/2017, 8:57 PM

## 2017-11-12 NOTE — ED Notes (Signed)
Returned from CT.
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
## 2017-11-12 NOTE — ED Notes (Signed)
To CT with RN, RT and MD.

## 2017-11-12 NOTE — ED Notes (Signed)
O Neg blood arrives from Blood Bank.
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
## 2017-11-13 ENCOUNTER — Encounter (HOSPITAL_COMMUNITY): Admission: EM | Disposition: A | Discharge status: Rehabilitation Facility | Payer: Self-pay | Source: Home / Self Care

## 2017-11-13 ENCOUNTER — Inpatient Hospital Stay (HOSPITAL_COMMUNITY): Payer: Medicaid Other

## 2017-11-13 ENCOUNTER — Encounter (HOSPITAL_COMMUNITY): Payer: Self-pay | Admitting: Anesthesiology

## 2017-11-13 ENCOUNTER — Inpatient Hospital Stay (HOSPITAL_COMMUNITY): Payer: Medicaid Other | Admitting: Anesthesiology

## 2017-11-13 ENCOUNTER — Encounter (HOSPITAL_COMMUNITY): Payer: Self-pay | Admitting: Emergency Medicine

## 2017-11-13 ENCOUNTER — Other Ambulatory Visit: Payer: Self-pay

## 2017-11-13 DIAGNOSIS — I998 Other disorder of circulatory system: Secondary | ICD-10-CM

## 2017-11-13 HISTORY — PX: AORTOGRAM: SHX6300

## 2017-11-13 LAB — CBC
HCT: 30.1 % — ABNORMAL LOW (ref 36.0–46.0)
HEMATOCRIT: 26.3 % — AB (ref 36.0–46.0)
HEMOGLOBIN: 10.1 g/dL — AB (ref 12.0–15.0)
Hemoglobin: 8.9 g/dL — ABNORMAL LOW (ref 12.0–15.0)
MCH: 28.1 pg (ref 26.0–34.0)
MCH: 28.3 pg (ref 26.0–34.0)
MCHC: 33.6 g/dL (ref 30.0–36.0)
MCHC: 33.8 g/dL (ref 30.0–36.0)
MCV: 83.6 fL (ref 78.0–100.0)
MCV: 83.8 fL (ref 78.0–100.0)
PLATELETS: 126 10*3/uL — AB (ref 150–400)
Platelets: 144 10*3/uL — ABNORMAL LOW (ref 150–400)
RBC: 3.6 MIL/uL — AB (ref 3.87–5.11)
RBC: 3.14 MIL/uL — ABNORMAL LOW (ref 3.87–5.11)
RDW: 15.1 % (ref 11.5–15.5)
RDW: 15.3 % (ref 11.5–15.5)
WBC: 16.8 10*3/uL — AB (ref 4.0–10.5)
WBC: 11.8 10*3/uL — AB (ref 4.0–10.5)

## 2017-11-13 LAB — BLOOD GAS, ARTERIAL
ACID-BASE DEFICIT: 5.2 mmol/L — AB (ref 0.0–2.0)
BICARBONATE: 18.9 mmol/L — AB (ref 20.0–28.0)
DRAWN BY: 24513
FIO2: 30
LHR: 16 {breaths}/min
MECHVT: 500 mL
O2 SAT: 97.4 %
PEEP/CPAP: 5 cmH2O
PH ART: 7.389 (ref 7.350–7.450)
PO2 ART: 97.2 mmHg (ref 83.0–108.0)
Patient temperature: 98.6
pCO2 arterial: 32 mmHg (ref 32.0–48.0)

## 2017-11-13 LAB — HIV ANTIBODY (ROUTINE TESTING W REFLEX): HIV Screen 4th Generation wRfx: NONREACTIVE

## 2017-11-13 LAB — POCT I-STAT 7, (LYTES, BLD GAS, ICA,H+H)
Acid-base deficit: 5 mmol/L — ABNORMAL HIGH (ref 0.0–2.0)
BICARBONATE: 21.4 mmol/L (ref 20.0–28.0)
CALCIUM ION: 1.16 mmol/L (ref 1.15–1.40)
HEMATOCRIT: 24 % — AB (ref 36.0–46.0)
Hemoglobin: 8.2 g/dL — ABNORMAL LOW (ref 12.0–15.0)
O2 SAT: 100 %
PH ART: 7.27 — AB (ref 7.350–7.450)
POTASSIUM: 4.2 mmol/L (ref 3.5–5.1)
SODIUM: 145 mmol/L (ref 135–145)
TCO2: 23 mmol/L (ref 22–32)
pCO2 arterial: 46.6 mmHg (ref 32.0–48.0)
pO2, Arterial: 332 mmHg — ABNORMAL HIGH (ref 83.0–108.0)

## 2017-11-13 LAB — BASIC METABOLIC PANEL
ANION GAP: 14 (ref 5–15)
BUN: 8 mg/dL (ref 6–20)
CHLORIDE: 119 mmol/L — AB (ref 101–111)
CO2: 13 mmol/L — ABNORMAL LOW (ref 22–32)
Calcium: 7.1 mg/dL — ABNORMAL LOW (ref 8.9–10.3)
Creatinine, Ser: 0.78 mg/dL (ref 0.44–1.00)
GFR calc non Af Amer: >60 mL/min (ref >60)
GLUCOSE: 104 mg/dL — AB (ref 65–99)
POTASSIUM: 4 mmol/L (ref 3.5–5.1)
Sodium: 146 mmol/L — ABNORMAL HIGH (ref 135–145)

## 2017-11-13 LAB — BLOOD PRODUCT ORDER (VERBAL) VERIFICATION

## 2017-11-13 LAB — LACTIC ACID, PLASMA: Lactic Acid, Venous: 2.9 mmol/L (ref 0.5–1.9)

## 2017-11-13 LAB — TRIGLYCERIDES: Triglycerides: 97 mg/dL (ref <150)

## 2017-11-13 LAB — MRSA PCR SCREENING: MRSA BY PCR: NEGATIVE

## 2017-11-13 LAB — ABO/RH: ABO/RH(D): A POS

## 2017-11-13 LAB — PREGNANCY, URINE: Preg Test, Ur: NEGATIVE

## 2017-11-13 LAB — ETHANOL: ALCOHOL ETHYL (B): 182 mg/dL — AB (ref <10)

## 2017-11-13 SURGERY — AORTOGRAM
Anesthesia: General | Laterality: Right

## 2017-11-13 MED ORDER — ONDANSETRON HCL 4 MG/2ML IJ SOLN
4.0000 mg | Freq: Four times a day (QID) | INTRAMUSCULAR | Status: DC | PRN
  Administered 2017-11-13: 4 mg via INTRAVENOUS

## 2017-11-13 MED ORDER — FENTANYL CITRATE (PF) 250 MCG/5ML IJ SOLN
INTRAMUSCULAR | Status: AC
  Filled 2017-11-13: qty 5

## 2017-11-13 MED ORDER — ORAL CARE MOUTH RINSE
15.0000 mL | Freq: Four times a day (QID) | OROMUCOSAL | Status: DC
  Administered 2017-11-13 – 2017-11-21 (×30): 15 mL via OROMUCOSAL

## 2017-11-13 MED ORDER — IOPAMIDOL (ISOVUE-370) INJECTION 76%
INTRAVENOUS | Status: AC
  Administered 2017-11-13: 100 mL
  Filled 2017-11-13: qty 100

## 2017-11-13 MED ORDER — FENTANYL CITRATE (PF) 100 MCG/2ML IJ SOLN
50.0000 ug | Freq: Once | INTRAMUSCULAR | Status: AC
  Administered 2017-11-13: 50 ug via INTRAVENOUS

## 2017-11-13 MED ORDER — SODIUM CHLORIDE 0.9% FLUSH
3.0000 mL | Freq: Two times a day (BID) | INTRAVENOUS | Status: DC
  Administered 2017-11-14 – 2017-11-17 (×5): 3 mL via INTRAVENOUS

## 2017-11-13 MED ORDER — ORAL CARE MOUTH RINSE: 15.0000 mL | OROMUCOSAL | Status: DC

## 2017-11-13 MED ORDER — PROPOFOL 1000 MG/100ML IV EMUL
0.0000 ug/kg/min | INTRAVENOUS | Status: DC
  Administered 2017-11-13 (×3): 50 ug/kg/min via INTRAVENOUS
  Administered 2017-11-13: 40 ug/kg/min via INTRAVENOUS
  Administered 2017-11-14: 50 ug/kg/min via INTRAVENOUS
  Administered 2017-11-14 – 2017-11-15 (×3): 20 ug/kg/min via INTRAVENOUS
  Administered 2017-11-16: 25 ug/kg/min via INTRAVENOUS
  Administered 2017-11-16 – 2017-11-17 (×2): 20 ug/kg/min via INTRAVENOUS
  Administered 2017-11-18: 30 ug/kg/min via INTRAVENOUS
  Filled 2017-11-13 (×15): qty 100

## 2017-11-13 MED ORDER — SODIUM CHLORIDE 0.9 % IV SOLN
0.0000 ug/min | INTRAVENOUS | Status: DC
  Administered 2017-11-13 (×3): 60 ug/min via INTRAVENOUS
  Administered 2017-11-13: 20 ug/min via INTRAVENOUS
  Administered 2017-11-13: 50 ug/min via INTRAVENOUS
  Administered 2017-11-14: 30 ug/min via INTRAVENOUS
  Administered 2017-11-14: 40 ug/min via INTRAVENOUS
  Administered 2017-11-14: 70 ug/min via INTRAVENOUS
  Administered 2017-11-14: 40 ug/min via INTRAVENOUS
  Administered 2017-11-15: 20 ug/min via INTRAVENOUS
  Administered 2017-11-15: 40 ug/min via INTRAVENOUS
  Administered 2017-11-15: 20 ug/min via INTRAVENOUS
  Administered 2017-11-16: 40 ug/min via INTRAVENOUS
  Administered 2017-11-16: 35 ug/min via INTRAVENOUS
  Filled 2017-11-13 (×2): qty 10
  Filled 2017-11-13 (×3): qty 1
  Filled 2017-11-13 (×3): qty 10
  Filled 2017-11-13: qty 1
  Filled 2017-11-13: qty 10
  Filled 2017-11-13 (×7): qty 1

## 2017-11-13 MED ORDER — MIDAZOLAM HCL 2 MG/2ML IJ SOLN
INTRAMUSCULAR | Status: AC
  Filled 2017-11-13: qty 2

## 2017-11-13 MED ORDER — FENTANYL CITRATE (PF) 250 MCG/5ML IJ SOLN
INTRAMUSCULAR | Status: DC | PRN
  Administered 2017-11-13 (×2): 50 ug via INTRAVENOUS

## 2017-11-13 MED ORDER — LACTATED RINGERS IV SOLN
INTRAVENOUS | Status: DC | PRN
  Administered 2017-11-13: 17:00:00 via INTRAVENOUS

## 2017-11-13 MED ORDER — PROPOFOL 1000 MG/100ML IV EMUL
0.0000 ug/kg/min | INTRAVENOUS | Status: DC
  Administered 2017-11-13: 40 ug/kg/min via INTRAVENOUS

## 2017-11-13 MED ORDER — SODIUM CHLORIDE 0.9 % IV SOLN: 250.0000 mL | INTRAVENOUS | Status: DC | PRN

## 2017-11-13 MED ORDER — SODIUM CHLORIDE 0.9% FLUSH: 3.0000 mL | INTRAVENOUS | Status: DC | PRN

## 2017-11-13 MED ORDER — SODIUM CHLORIDE 0.9 % IV SOLN: INTRAVENOUS | Status: AC

## 2017-11-13 MED ORDER — CHLORHEXIDINE GLUCONATE 0.12% ORAL RINSE (MEDLINE KIT)
15.0000 mL | Freq: Two times a day (BID) | OROMUCOSAL | Status: DC
  Administered 2017-11-13 – 2017-11-20 (×17): 15 mL via OROMUCOSAL

## 2017-11-13 MED ORDER — PANTOPRAZOLE SODIUM 40 MG IV SOLR
40.0000 mg | Freq: Every day | INTRAVENOUS | Status: DC
  Administered 2017-11-13 – 2017-11-20 (×8): 40 mg via INTRAVENOUS
  Filled 2017-11-13 (×8): qty 40

## 2017-11-13 MED ORDER — SODIUM CHLORIDE 0.9 % IJ SOLN
INTRAVENOUS | Status: DC | PRN
  Administered 2017-11-13: 150 mL via INTRAMUSCULAR

## 2017-11-13 MED ORDER — ARTIFICIAL TEARS OPHTHALMIC OINT
TOPICAL_OINTMENT | OPHTHALMIC | Status: DC | PRN
  Administered 2017-11-13: 1 via OPHTHALMIC

## 2017-11-13 MED ORDER — ARTIFICIAL TEARS OPHTHALMIC OINT
TOPICAL_OINTMENT | OPHTHALMIC | Status: AC
  Filled 2017-11-13: qty 3.5

## 2017-11-13 MED ORDER — PANTOPRAZOLE SODIUM 40 MG PO TBEC
40.0000 mg | DELAYED_RELEASE_TABLET | Freq: Every day | ORAL | Status: DC
  Administered 2017-11-21 – 2017-11-24 (×4): 40 mg via ORAL
  Filled 2017-11-13 (×4): qty 1

## 2017-11-13 MED ORDER — ONDANSETRON HCL 4 MG/2ML IJ SOLN
INTRAMUSCULAR | Status: AC
  Filled 2017-11-13: qty 2

## 2017-11-13 MED ORDER — SODIUM CHLORIDE 0.9 % IV SOLN
INTRAVENOUS | Status: DC | PRN
  Administered 2017-11-13: 500 mL

## 2017-11-13 MED ORDER — LIDOCAINE HCL (PF) 1 % IJ SOLN
INTRAMUSCULAR | Status: AC
  Filled 2017-11-13: qty 30

## 2017-11-13 MED ORDER — ONDANSETRON 4 MG PO TBDP
4.0000 mg | ORAL_TABLET | Freq: Four times a day (QID) | ORAL | Status: DC | PRN
  Filled 2017-11-13: qty 1

## 2017-11-13 MED ORDER — KCL IN DEXTROSE-NACL 20-5-0.45 MEQ/L-%-% IV SOLN
INTRAVENOUS | Status: DC
  Administered 2017-11-13 – 2017-11-23 (×14): via INTRAVENOUS
  Filled 2017-11-13 (×17): qty 1000

## 2017-11-13 MED ORDER — FENTANYL 2500MCG IN NS 250ML (10MCG/ML) PREMIX INFUSION
25.0000 ug/h | INTRAVENOUS | Status: DC
  Administered 2017-11-13: 50 ug/h via INTRAVENOUS
  Administered 2017-11-13: 95 ug/h via INTRAVENOUS
  Administered 2017-11-14 – 2017-11-15 (×3): 250 ug/h via INTRAVENOUS
  Administered 2017-11-15 – 2017-11-16 (×3): 200 ug/h via INTRAVENOUS
  Administered 2017-11-17: 250 ug/h via INTRAVENOUS
  Administered 2017-11-17 – 2017-11-18 (×2): 200 ug/h via INTRAVENOUS
  Filled 2017-11-13 (×11): qty 250

## 2017-11-13 MED ORDER — PROPOFOL 10 MG/ML IV BOLUS
INTRAVENOUS | Status: AC
  Filled 2017-11-13: qty 20

## 2017-11-13 MED ORDER — 0.9 % SODIUM CHLORIDE (POUR BTL) OPTIME
TOPICAL | Status: DC | PRN
  Administered 2017-11-13: 2000 mL

## 2017-11-13 MED ORDER — FENTANYL BOLUS VIA INFUSION
50.0000 ug | INTRAVENOUS | Status: DC | PRN
  Administered 2017-11-13 – 2017-11-18 (×10): 50 ug via INTRAVENOUS
  Filled 2017-11-13: qty 50

## 2017-11-13 MED ORDER — CHLORHEXIDINE GLUCONATE 0.12% ORAL RINSE (MEDLINE KIT): 15.0000 mL | Freq: Two times a day (BID) | OROMUCOSAL | Status: DC

## 2017-11-13 MED ORDER — SODIUM CHLORIDE 0.9 % IV SOLN: INTRAVENOUS | Status: DC | PRN

## 2017-11-13 SURGICAL SUPPLY — 97 items
BAG BANDED W/RUBBER/TAPE 36X54 (MISCELLANEOUS) ×2 IMPLANT
BANDAGE ACE 4X5 VEL STRL LF (GAUZE/BANDAGES/DRESSINGS) IMPLANT
BANDAGE ACE 6X5 VEL STRL LF (GAUZE/BANDAGES/DRESSINGS) IMPLANT
BANDAGE ESMARK 6X9 LF (GAUZE/BANDAGES/DRESSINGS) IMPLANT
BLADE SURG 11 STRL SS (BLADE) ×2 IMPLANT
BNDG ESMARK 6X9 LF (GAUZE/BANDAGES/DRESSINGS)
BNDG GAUZE ELAST 4 BULKY (GAUZE/BANDAGES/DRESSINGS) IMPLANT
CANISTER SUCT 3000ML PPV (MISCELLANEOUS) ×2 IMPLANT
CANNULA VESSEL 3MM 2 BLNT TIP (CANNULA) ×4 IMPLANT
CATH ANGIO 5F BER2 65CM (CATHETERS) ×2 IMPLANT
CATH CROSS OVER TEMPO 5F (CATHETERS) ×4 IMPLANT
CATH OMNI FLUSH .035X70CM (CATHETERS) ×4 IMPLANT
CATH SOFT-VU 4F 65 STRAIGHT (CATHETERS) ×1 IMPLANT
CATH SOFT-VU STRAIGHT 4F 65CM (CATHETERS) ×1
CHLORAPREP W/TINT 26ML (MISCELLANEOUS) ×2 IMPLANT
CLIP VESOCCLUDE MED 24/CT (CLIP) ×2 IMPLANT
CLIP VESOCCLUDE SM WIDE 24/CT (CLIP) ×2 IMPLANT
COVER BACK TABLE 80X110 HD (DRAPES) ×4 IMPLANT
COVER DOME SNAP 22 D (MISCELLANEOUS) ×2 IMPLANT
COVER PROBE W GEL 5X96 (DRAPES) ×2 IMPLANT
COVER SURGICAL LIGHT HANDLE (MISCELLANEOUS) ×2 IMPLANT
CUFF TOURNIQUET SINGLE 24IN (TOURNIQUET CUFF) IMPLANT
CUFF TOURNIQUET SINGLE 34IN LL (TOURNIQUET CUFF) IMPLANT
CUFF TOURNIQUET SINGLE 44IN (TOURNIQUET CUFF) IMPLANT
DERMABOND ADVANCED (GAUZE/BANDAGES/DRESSINGS) ×1
DERMABOND ADVANCED .7 DNX12 (GAUZE/BANDAGES/DRESSINGS) ×1 IMPLANT
DEVICE INFLATION ENCORE 26 (MISCELLANEOUS) IMPLANT
DEVICE TORQUE KENDALL .025-038 (MISCELLANEOUS) IMPLANT
DRAIN SNY WOU (WOUND CARE) IMPLANT
DRAPE FEMORAL ANGIO 80X135IN (DRAPES) ×4 IMPLANT
DRAPE HALF SHEET 40X57 (DRAPES) IMPLANT
DRAPE X-RAY CASS 24X20 (DRAPES) IMPLANT
DRSG MEPILEX BORDER 4X4 (GAUZE/BANDAGES/DRESSINGS) ×4 IMPLANT
DRSG TEGADERM 4X4.75 (GAUZE/BANDAGES/DRESSINGS) ×2 IMPLANT
ELECT REM PT RETURN 9FT ADLT (ELECTROSURGICAL) ×2
ELECTRODE REM PT RTRN 9FT ADLT (ELECTROSURGICAL) ×1 IMPLANT
EVACUATOR SILICONE 100CC (DRAIN) IMPLANT
GAUZE SPONGE 2X2 8PLY NS (GAUZE/BANDAGES/DRESSINGS) ×2 IMPLANT
GAUZE SPONGE 4X4 12PLY STRL (GAUZE/BANDAGES/DRESSINGS) ×2 IMPLANT
GAUZE SPONGE 4X4 12PLY STRL LF (GAUZE/BANDAGES/DRESSINGS) ×2 IMPLANT
GAUZE SPONGE 4X4 16PLY XRAY LF (GAUZE/BANDAGES/DRESSINGS) ×2 IMPLANT
GLIDEWIRE ANGLED SS 035X260CM (WIRE) IMPLANT
GLOVE BIO SURGEON STRL SZ7.5 (GLOVE) ×2 IMPLANT
GLOVE BIOGEL PI IND STRL 7.0 (GLOVE) ×3 IMPLANT
GLOVE BIOGEL PI INDICATOR 7.0 (GLOVE) ×3
GOWN STRL REUS W/ TWL LRG LVL3 (GOWN DISPOSABLE) ×4 IMPLANT
GOWN STRL REUS W/TWL LRG LVL3 (GOWN DISPOSABLE) ×4
HEMOSTAT SPONGE AVITENE ULTRA (HEMOSTASIS) IMPLANT
KIT BASIN OR (CUSTOM PROCEDURE TRAY) ×2 IMPLANT
KIT ROOM TURNOVER OR (KITS) ×2 IMPLANT
NEEDLE HYPO 25GX1X1/2 BEV (NEEDLE) IMPLANT
NS IRRIG 1000ML POUR BTL (IV SOLUTION) ×4 IMPLANT
PACK GENERAL/GYN (CUSTOM PROCEDURE TRAY) IMPLANT
PACK PERIPHERAL VASCULAR (CUSTOM PROCEDURE TRAY) ×2 IMPLANT
PACK UNIVERSAL I (CUSTOM PROCEDURE TRAY) IMPLANT
PAD ARMBOARD 7.5X6 YLW CONV (MISCELLANEOUS) ×4 IMPLANT
PROTECTION STATION PRESSURIZED (MISCELLANEOUS) ×2
SET COLLECT BLD 21X3/4 12 (NEEDLE) IMPLANT
SET MICROPUNCTURE 5F STIFF (MISCELLANEOUS) ×2 IMPLANT
SHEATH AVANTI 11CM 5FR (MISCELLANEOUS) ×2 IMPLANT
SHEATH PINNACLE ST 7F 45CM (SHEATH) IMPLANT
STAPLER VISISTAT 35W (STAPLE) IMPLANT
STATION PROTECTION PRESSURIZED (MISCELLANEOUS) ×1 IMPLANT
STOPCOCK 4 WAY LG BORE MALE ST (IV SETS) IMPLANT
STOPCOCK MORSE 400PSI 3WAY (MISCELLANEOUS) ×2 IMPLANT
SUT ETHILON 3 0 PS 1 (SUTURE) IMPLANT
SUT PROLENE 5 0 C 1 24 (SUTURE) ×2 IMPLANT
SUT PROLENE 6 0 CC (SUTURE) ×2 IMPLANT
SUT PROLENE 7 0 BV 1 (SUTURE) IMPLANT
SUT PROLENE 7 0 BV1 MDA (SUTURE) IMPLANT
SUT SILK 2 0 SH (SUTURE) ×2 IMPLANT
SUT SILK 3 0 (SUTURE)
SUT SILK 3-0 18XBRD TIE 12 (SUTURE) IMPLANT
SUT VIC AB 2-0 CT1 27 (SUTURE) ×1
SUT VIC AB 2-0 CT1 TAPERPNT 27 (SUTURE) ×1 IMPLANT
SUT VIC AB 2-0 CTX 36 (SUTURE) IMPLANT
SUT VIC AB 2-0 SH 27 (SUTURE) ×2
SUT VIC AB 2-0 SH 27XBRD (SUTURE) ×2 IMPLANT
SUT VIC AB 3-0 SH 27 (SUTURE) ×5
SUT VIC AB 3-0 SH 27X BRD (SUTURE) ×5 IMPLANT
SUT VIC AB 4-0 PS2 18 (SUTURE) ×2 IMPLANT
SUT VIC AB 4-0 PS2 27 (SUTURE) ×4 IMPLANT
SUT VICRYL 4-0 PS2 18IN ABS (SUTURE) IMPLANT
SYR 10ML LL (SYRINGE) ×4 IMPLANT
SYR 20CC LL (SYRINGE) ×4 IMPLANT
SYR 30ML LL (SYRINGE) ×4 IMPLANT
SYR CONTROL 10ML LL (SYRINGE) IMPLANT
SYR MEDRAD MARK V 150ML (SYRINGE) ×2 IMPLANT
TAPE UMBILICAL COTTON 1/8X30 (MISCELLANEOUS) ×2 IMPLANT
TOWEL GREEN STERILE (TOWEL DISPOSABLE) ×4 IMPLANT
TRAY FOLEY W/METER SILVER 16FR (SET/KITS/TRAYS/PACK) ×2 IMPLANT
TUBING EXTENTION W/L.L. (IV SETS) IMPLANT
TUBING HIGH PRESSURE 120CM (CONNECTOR) ×2 IMPLANT
UNDERPAD 30X30 (UNDERPADS AND DIAPERS) ×2 IMPLANT
WATER STERILE IRR 1000ML POUR (IV SOLUTION) ×2 IMPLANT
WIRE BENTSON .035X145CM (WIRE) ×2 IMPLANT
WIRE HI TORQ VERSACORE J 260CM (WIRE) ×2 IMPLANT

## 2017-11-13 NOTE — Progress Notes (Signed)
Patient ID: Kristine Mosley, female   DOB: Apr 16, 1986, 32 y.o.   MRN: [id number] I was able to review the CT scan of the patient's right knee.  It does show a tibial plateau fracture that starts laterally but has a slight bicondylar component.  This is on the same side that she has a midshaft tib-fib fracture.  Certainly addressing both fractures will be crucial but also complicating in terms of being able to address the tibial plateau fracture and then potentially place an intramedullary nail in the tibia.  I am worried about her soft tissues of the mid tibia and soft tissue compromise.  This morning her compartments feel soft but full and not firm.  Due to the complex nature of her right lower extremity injury will also consult Dr. Altamese Bellingham, orthopedic traumatologist for his expertise at evaluating and treating such injuries.  I will check on this today myself as well.

## 2017-11-13 NOTE — Anesthesia Postprocedure Evaluation (Signed)
Anesthesia Post Note  Patient: Kristine Mosley  Procedure(s) Performed: AORTOGRAM WITH LOWER EXTREMITY RUN OFF (Right )     Patient location during evaluation: SICU Anesthesia Type: General Level of consciousness: sedated Pain management: pain level controlled Vital Signs Assessment: post-procedure vital signs reviewed and stable Respiratory status: patient remains intubated per anesthesia plan Cardiovascular status: stable Postop Assessment: no apparent nausea or vomiting Anesthetic complications: no    Last Vitals:  Vitals:   11/13/17 1645 11/13/17 1700  BP: (!) 101/59 102/61  Pulse: 65 66  Resp: (!) 9 (!) 8  Temp: 37.1 C 37.1 C  SpO2: 100% 100%    Last Pain:  Vitals:   11/13/17 1045  TempSrc: Other (Comment)  PainSc:                  Audry Pili
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
## 2017-11-13 NOTE — Progress Notes (Addendum)
Patient ID: Kristine Mosley, female   DOB: [birth year], 32 y.o.   MRN: [id number] Follow up - Trauma Critical Care  Patient Details:    Kristine Mosley is an 32 y.o. female.  Lines/tubes : Airway 7.5 mm (Active)  Secured at (cm) 22 cm 11/13/2017  8:04 AM  Measured From Lips 11/13/2017  8:04 AM  Secured Location Left 11/13/2017  8:04 AM  Secured By [employer] 11/13/2017  8:04 AM  Tube Holder Repositioned Yes 11/13/2017  8:04 AM  Cuff Pressure (cm H2O) 26 cm H2O 11/12/2017  9:04 PM  Site Condition Dry 11/13/2017  8:04 AM     NG/OG Tube Orogastric 18 Fr. Center mouth Xray;Aucultation (Active)  External Length of Tube (cm) - (if applicable) 67 cm 4/88/8916  1:00 AM  Site Assessment Clean;Dry;Intact 11/13/2017  8:00 AM  Ongoing Placement Verification No change in cm markings or external length of tube from initial placement;No change in respiratory status;No acute changes, not attributed to clinical condition 11/13/2017  8:00 AM  Status Suction-low intermittent 11/13/2017  8:00 AM  Amount of suction 115 mmHg 11/13/2017  1:00 AM  Drainage Appearance Owens Shark;Tan 11/13/2017  1:00 AM  Output (mL) 150 mL 11/13/2017  6:00 AM     Urethral Catheter Marylen Ponto, RN Latex;Straight-tip;Temperature probe 16 Fr. (Active)  Indication for Insertion or Continuance of Catheter Unstable critical patients (first 24-48 hours) 11/13/2017  8:00 AM  Site Assessment Clean;Intact 11/13/2017  8:00 AM  Catheter Maintenance Bag below level of bladder;Catheter secured;Drainage bag/tubing not touching floor;No dependent loops;Seal intact 11/13/2017  8:00 AM  Collection Container Standard drainage bag 11/13/2017  8:00 AM  Securement Method Securing device (Describe) 11/13/2017  8:00 AM  Urinary Catheter Interventions Unclamped 11/13/2017  8:00 AM  Output (mL) 125 mL 11/13/2017  8:00 AM    Microbiology/Sepsis markers: Results for orders placed or performed during the hospital encounter of 11/12/17  MRSA PCR Screening      Status: None   Collection Time: 11/13/17  1:07 AM  Result Value Ref Range Status   MRSA by PCR NEGATIVE NEGATIVE Final    Comment:        The GeneXpert MRSA Assay (FDA approved for NASAL specimens only), is one component of a comprehensive MRSA colonization surveillance program. It is not intended to diagnose MRSA infection nor to guide or monitor treatment for MRSA infections.     Anti-infectives:  Anti-infectives (From admission, onward)   None      Best Practice/Protocols:  VTE Prophylaxis: Mechanical GI Prophylaxis: Proton Pump Inhibitor Continous Sedation  Consults: Treatment Team:  Mcarthur Rossetti, MD    Studies:    Events:  Subjective:    Overnight Issues:  On neo to maintain BP;  Objective:  Vital signs for last 24 hours: Temp:  [96.6 F (35.9 C)-98.2 F (36.8 C)] 98.2 F (36.8 C) (01/27 0400) Pulse Rate:  [32-145] 77 (01/27 0803) Resp:  [0-35] 16 (01/27 0803) BP: (86-169)/(54-127) 100/66 (01/27 0803) SpO2:  [97 %-100 %] 100 % (01/27 0800) FiO2 (%):  [30 %-100 %] 30 % (01/27 0804) Weight:  [57.6 kg (126 lb 15.8 oz)-60 kg (132 lb 4.4 oz)] 57.6 kg (126 lb 15.8 oz) (01/27 0055)  Hemodynamic parameters for last 24 hours:    Intake/Output from previous day: 01/26 0701 - 01/27 0700 In: 3429.7 [I.V.:3114.7] Out: 2325 [Urine:2175; Emesis/NG output:150]  Intake/Output this shift: Total I/O In: 205.3 [I.V.:205.3] Out: 125 [Urine:125]  Vent settings for last 24 hours: Vent Mode: PRVC FiO2 (%):  [  30 %-100 %] 30 % Set Rate:  [16 bmp] 16 bmp Vt Set:  [500 mL] 500 mL PEEP:  [5 cmH20] 5 cmH20 Plateau Pressure:  [12 cmH20-14 cmH20] 14 cmH20  Physical Exam:  Gen: intubated/sedated; Neuro: sedated; OE to voice, nonfocal, PERR Pulm: symmetric; cta b/l CV: regular, no MRG; biphasic Doppler in LLE; cant find Doppler DP in RLE;  GI: soft, nt, nd Ext: RLE splint; cool Rt foot with extensive bruising  Results for orders placed or  performed during the hospital encounter of 11/12/17 (from the past 24 hour(s))  Prepare fresh frozen plasma     Status: None   Collection Time: 11/12/17  8:20 PM  Result Value Ref Range   Unit Number [id number]    Blood Component Type LIQ PLASMA    Unit division 00    Status of Unit REL FROM [facility]    Unit tag comment VERBAL ORDERS PER DR KOHUT    Transfusion Status OK TO TRANSFUSE    Unit Number [id number]    Blood Component Type LIQ PLASMA    Unit division 00    Status of Unit REL FROM [facility]    Unit tag comment VERBAL ORDERS PER DR KOHUT    Transfusion Status OK TO TRANSFUSE   I-Stat Chem 8, ED     Status: Abnormal   Collection Time: 11/12/17  9:00 PM  Result Value Ref Range   Sodium 148 (H) 135 - 145 mmol/L   Potassium 3.5 3.5 - 5.1 mmol/L   Chloride 118 (H) 101 - 111 mmol/L   BUN 10 6 - 20 mg/dL   Creatinine, Ser 0.70 0.44 - 1.00 mg/dL   Glucose, Bld 105 (H) 65 - 99 mg/dL   Calcium, Ion 0.83 (LL) 1.15 - 1.40 mmol/L   TCO2 15 (L) 22 - 32 mmol/L   Hemoglobin 9.2 (L) 12.0 - 15.0 g/dL   HCT 27.0 (L) 36.0 - 46.0 %   Comment NOTIFIED PHYSICIAN   I-Stat CG4 Lactic Acid, ED     Status: Abnormal   Collection Time: 11/12/17  9:02 PM  Result Value Ref Range   Lactic Acid, Venous 3.26 (HH) 0.5 - 1.9 mmol/L   Comment NOTIFIED PHYSICIAN   Type and screen Ordered by PROVIDER DEFAULT     Status: None   Collection Time: 11/12/17  9:05 PM  Result Value Ref Range   ABO/RH(D) A POS    Antibody Screen NEG    Sample Expiration 11/15/2017    Unit Number [id number]    Blood Component Type RED CELLS,LR    Unit division 00    Status of Unit ISSUED,FINAL    Unit tag comment VERBAL ORDERS PER DR KOHUT    Transfusion Status OK TO TRANSFUSE    Crossmatch Result COMPATIBLE    Unit Number [id number]    Blood Component Type RED CELLS,LR    Unit division 00    Status of Unit REL FROM [facility]    Unit tag comment VERBAL ORDERS PER DR KOHUT    Transfusion Status OK TO TRANSFUSE     Crossmatch Result NOT NEEDED   CDS serology     Status: None   Collection Time: 11/12/17  9:05 PM  Result Value Ref Range   CDS serology specimen      SPECIMEN WILL BE HELD FOR 14 DAYS IF TESTING IS REQUIRED  Comprehensive metabolic panel     Status: Abnormal   Collection Time: 11/12/17  9:05 PM  Result Value Ref Range  Sodium 143 135 - 145 mmol/L   Potassium 3.3 (L) 3.5 - 5.1 mmol/L   Chloride 117 (H) 101 - 111 mmol/L   CO2 14 (L) 22 - 32 mmol/L   Glucose, Bld 134 (H) 65 - 99 mg/dL   BUN 10 6 - 20 mg/dL   Creatinine, Ser 0.78 0.44 - 1.00 mg/dL   Calcium 7.0 (L) 8.9 - 10.3 mg/dL   Total Protein 5.1 (L) 6.5 - 8.1 g/dL   Albumin 3.0 (L) 3.5 - 5.0 g/dL   AST 130 (H) 15 - 41 U/L   ALT 68 (H) 14 - 54 U/L   Alkaline Phosphatase 52 38 - 126 U/L   Total Bilirubin 0.5 0.3 - 1.2 mg/dL   GFR calc non Af Amer >60 >60 mL/min   GFR calc Af Amer >60 >60 mL/min   Anion gap 12 5 - 15  CBC     Status: Abnormal   Collection Time: 11/12/17  9:05 PM  Result Value Ref Range   WBC 26.6 (H) 4.0 - 10.5 K/uL   RBC 4.13 3.87 - 5.11 MIL/uL   Hemoglobin 11.4 (L) 12.0 - 15.0 g/dL   HCT 35.1 (L) 36.0 - 46.0 %   MCV 85.0 78.0 - 100.0 fL   MCH 27.6 26.0 - 34.0 pg   MCHC 32.5 30.0 - 36.0 g/dL   RDW 14.9 11.5 - 15.5 %   Platelets 185 150 - 400 K/uL  Protime-INR     Status: Abnormal   Collection Time: 11/12/17  9:05 PM  Result Value Ref Range   Prothrombin Time 16.9 (H) 11.4 - 15.2 seconds   INR 1.39   ABO/Rh     Status: None   Collection Time: 11/12/17  9:05 PM  Result Value Ref Range   ABO/RH(D) A POS   I-Stat arterial blood gas, ED     Status: Abnormal   Collection Time: 11/12/17  9:57 PM  Result Value Ref Range   pH, Arterial 7.266 (L) 7.350 - 7.450   pCO2 arterial 35.5 32.0 - 48.0 mmHg   pO2, Arterial 310.0 (H) 83.0 - 108.0 mmHg   Bicarbonate 16.3 (L) 20.0 - 28.0 mmol/L   TCO2 17 (L) 22 - 32 mmol/L   O2 Saturation 100.0 %   Acid-base deficit 10.0 (H) 0.0 - 2.0 mmol/L   Patient temperature  97.0 F    Collection site RADIAL, ALLEN'S TEST ACCEPTABLE    Drawn by RT    Sample type ARTERIAL   Ethanol     Status: Abnormal   Collection Time: 11/12/17 11:30 PM  Result Value Ref Range   Alcohol, Ethyl (B) 182 (H) <10 mg/dL  Urinalysis, Routine w reflex microscopic     Status: Abnormal   Collection Time: 11/12/17 11:30 PM  Result Value Ref Range   Color, Urine YELLOW YELLOW   APPearance CLEAR CLEAR   Specific Gravity, Urine >1.046 (H) 1.005 - 1.030   pH 5.0 5.0 - 8.0   Glucose, UA 50 (A) NEGATIVE mg/dL   Hgb urine dipstick LARGE (A) NEGATIVE   Bilirubin Urine NEGATIVE NEGATIVE   Ketones, ur NEGATIVE NEGATIVE mg/dL   Protein, ur NEGATIVE NEGATIVE mg/dL   Nitrite NEGATIVE NEGATIVE   Leukocytes, UA NEGATIVE NEGATIVE   RBC / HPF 6-30 0 - 5 RBC/hpf   WBC, UA 6-30 0 - 5 WBC/hpf   Bacteria, UA NONE SEEN NONE SEEN   Squamous Epithelial / LPF NONE SEEN NONE SEEN   Mucus PRESENT   MRSA PCR Screening  Status: None   Collection Time: 11/13/17  1:07 AM  Result Value Ref Range   MRSA by PCR NEGATIVE NEGATIVE  Basic metabolic panel     Status: Abnormal   Collection Time: 11/13/17  3:10 AM  Result Value Ref Range   Sodium 146 (H) 135 - 145 mmol/L   Potassium 4.0 3.5 - 5.1 mmol/L   Chloride 119 (H) 101 - 111 mmol/L   CO2 13 (L) 22 - 32 mmol/L   Glucose, Bld 104 (H) 65 - 99 mg/dL   BUN 8 6 - 20 mg/dL   Creatinine, Ser 0.78 0.44 - 1.00 mg/dL   Calcium 7.1 (L) 8.9 - 10.3 mg/dL   GFR calc non Af Amer >60 >60 mL/min   GFR calc Af Amer >60 >60 mL/min   Anion gap 14 5 - 15  Triglycerides     Status: None   Collection Time: 11/13/17  3:10 AM  Result Value Ref Range   Triglycerides 97 <150 mg/dL  Blood gas, arterial     Status: Abnormal   Collection Time: 11/13/17  5:15 AM  Result Value Ref Range   FIO2 30.00    Delivery systems VENTILATOR    Mode PRESSURE REGULATED VOLUME CONTROL    VT 500 mL   LHR 16 resp/min   Peep/cpap 5.0 cm H20   pH, Arterial 7.389 7.350 - 7.450   pCO2  arterial 32.0 32.0 - 48.0 mmHg   pO2, Arterial 97.2 83.0 - 108.0 mmHg   Bicarbonate 18.9 (L) 20.0 - 28.0 mmol/L   Acid-base deficit 5.2 (H) 0.0 - 2.0 mmol/L   O2 Saturation 97.4 %   Patient temperature 98.6    Collection site LEFT RADIAL    Drawn by [phone]    Sample type ARTERIAL DRAW    Allens test (pass/fail) PASS PASS  CBC     Status: Abnormal   Collection Time: 11/13/17  6:04 AM  Result Value Ref Range   WBC 16.8 (H) 4.0 - 10.5 K/uL   RBC 3.60 (L) 3.87 - 5.11 MIL/uL   Hemoglobin 10.1 (L) 12.0 - 15.0 g/dL   HCT 30.1 (L) 36.0 - 46.0 %   MCV 83.6 78.0 - 100.0 fL   MCH 28.1 26.0 - 34.0 pg   MCHC 33.6 30.0 - 36.0 g/dL   RDW 15.1 11.5 - 15.5 %   Platelets 144 (L) 150 - 400 K/uL  Lactic acid, plasma     Status: Abnormal   Collection Time: 11/13/17  6:04 AM  Result Value Ref Range   Lactic Acid, Venous 2.9 (HH) 0.5 - 1.9 mmol/L    Assessment & Plan: Present on Admission: . Pelvic fracture (HCC) PHBC Scalp lac - closed in ED L retroperitoneal hematoma - observe T 12 FX - keep flat, Dr. Cyndy Freeze, no brace per dr ditty R tib fib FX - splint, Dr. Ninfa Linden to discuss with Dr Marcelino Scot; in discussion with Dr Ninfa Linden her right foot/leg appearance is improved from last night and feels the same as last night. Will CTA RLE to evaluate blood flow R acetab and R inf ramus FX  Pulm: oxy/vent well; cont current vent settings for prob OR in next 24 hrs CV: wean Neo for MAP >60, place a line GI: NPO, ng to LIWS, PPI Renal: cont foley, uop ok, Cr ok FEN: cont mIVF, repeat labs in am; npo; if appears will have multiple sx over next few days will start enteral feeds Heme: hgb down slightly; repeat this pm and in AM  VTE prophylaxis: scds only for now, start lovenox once Hgb stable    LOS: 1 day   Additional comments:I reviewed the patient's new clinical lab test results. I reviewed the patients new imaging test results.  and I discussed the patient's care with Dr. Ninfa Linden and Ditty.  Critical  Care Total Time*: Loving. Redmond Pulling, MD, FACS General, Bariatric, & Minimally Invasive Surgery [facility] Surgery, [place]   11/13/2017  *Care during the described time interval was provided by me. I have reviewed this patient's available data, including medical history, events of note, physical examination and test results as part of my evaluation.

## 2017-11-13 NOTE — Op Note (Signed)
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  The patient was placed in supine position on the operating table.  After induction of general anesthesia the patient's right and left groins were prepped and draped in usual sterile fashion.  Ultrasound was used to identify the left common femoral artery.  An introducer needle was used to cannulate the left common femoral artery and an 035 Bentson wire threaded up in the abdominal aorta under fluoroscopic guidance.  A 5 French sheath was placed over the guidewire and the left common femoral artery.  This was thoroughly flushed with heparinized saline.  A 5 French Omni Flush catheter was then placed up in the abdominal aorta and abdominal aortogram was obtained in AP projection.  The left and right renal arteries are patent.  The infrarenal abdominal aorta is patent.  The left and right common external and internal iliac arteries are all widely patent.  The vessels are small diffusely.  At this point the Omni Flush catheter was removed over a guidewire and a 5 [place] crossover catheter was used to selectively catheterize the right common iliac and then I advanced the Bentson wire down into the distal right external iliac.  A crossover catheter was removed and exchanged for a 4 French straight catheter.  A right lower extremity arteriogram was then obtained through this catheter.  The right common femoral profunda femoris and superficial  femoral arteries are all widely patent.  The right popliteal artery is patent.  The takeoff of the anterior tibial artery is patent.  However this occludes in the mid leg.  The peroneal artery is not well visualized and appears to be occluded.  The posterior tibial artery is patent all the way down into the foot and constitutes a plantar arch.  This feels completely.  There is diffuse spasm within the posterior tibial artery and this is a very small vessel around 1 mm in diameter.  At this point a 4 [place] catheter was removed.  The 5 French sheath was removed and hemostasis obtained with direct pressure.  The patient tolerated procedure well and there were no complications.  The patient was taken back to the intensive care unit in stable condition.  Ruta Hinds, MD [facility] Office: [phone] Pager: [phone]
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
## 2017-11-13 NOTE — Transfer of Care (Signed)
Immediate Anesthesia Transfer of Care Note  Patient: Kristine Mosley  Procedure(s) Performed: AORTOGRAM WITH LOWER EXTREMITY RUN OFF (Right )  Patient Location: ICU  Anesthesia Type:General  Level of Consciousness: sedated and Patient remains intubated per anesthesia plan  Airway & Oxygen Therapy: Patient remains intubated per anesthesia plan and Patient placed on Ventilator (see vital sign flow sheet for setting)  Post-op Assessment: Report given to RN and Post -op Vital signs reviewed and stable  Post vital signs: Reviewed and stable  Last Vitals:  Vitals:   11/13/17 1645 11/13/17 1700  BP: (!) 101/59 102/61  Pulse: 65 66  Resp: (!) 9 (!) 8  Temp: 37.1 C 37.1 C  SpO2: 100% 100%    Last Pain:  Vitals:   11/13/17 1045  TempSrc: Other (Comment)  PainSc:          Complications: No apparent anesthesia complications

## 2017-11-13 NOTE — Procedures (Signed)
Arterial Catheter Insertion Procedure Note TOSHIBA NULL [id number] 12-14-1985  Procedure: Insertion of Arterial Catheter  Indications: Blood pressure monitoring and Frequent blood sampling  Procedure Details Consent: Unable to obtain consent because of altered level of consciousness. Time Out: Verified patient identification, verified procedure, site/side was marked, verified correct patient position, special equipment/implants available, medications/allergies/relevent history reviewed, required imaging and test results available.  Performed  Maximum sterile technique was used including antiseptics, cap, gloves, gown, hand hygiene, mask and sheet. Skin prep: Chlorhexidine; local anesthetic administered 20 gauge catheter was inserted into right radial artery using the Seldinger technique.  Evaluation Blood flow good; BP tracing good. Complications: No apparent complications.   Mcneil Sober 11/13/2017

## 2017-11-13 NOTE — Progress Notes (Signed)
Patient transported to CT and returned to 4N19. No complications. Vital signs stable at this time. RN at bedside. RT will continue to monitor.

## 2017-11-13 NOTE — Anesthesia Preprocedure Evaluation (Signed)
Anesthesia Evaluation  Patient identified by MRN, date of birth, ID band Patient unresponsive    Reviewed: Allergy & Precautions, NPO status , Patient's Chart, lab work & pertinent test results, Unable to perform ROS - Chart review only  Airway Mallampati: Intubated       Dental   Pulmonary    Pulmonary exam normal breath sounds clear to auscultation       Cardiovascular Normal cardiovascular exam Rhythm:Regular Rate:Normal     Neuro/Psych TBI    GI/Hepatic   Endo/Other    Renal/GU      Musculoskeletal Right fibula and tibial plateau fractures, T12 fracture, right acetabulum and pubic ramus fracture    Abdominal   Peds  Hematology  (+) anemia ,   Anesthesia Other Findings Retroperitoneal hematoma  Reproductive/Obstetrics                             Anesthesia Physical Anesthesia Plan  ASA: III  Anesthesia Plan: General   Post-op Pain Management:    Induction: Inhalational  PONV Risk Score and Plan: Treatment may vary due to age or medical condition  Airway Management Planned: Oral ETT  Additional Equipment: None  Intra-op Plan:   Post-operative Plan: Post-operative intubation/ventilation  Informed Consent: I have reviewed the patients History and Physical, chart, labs and discussed the procedure including the risks, benefits and alternatives for the proposed anesthesia with the patient or authorized representative who has indicated his/her understanding and acceptance.     Plan Discussed with: CRNA  Anesthesia Plan Comments:         Anesthesia Quick Evaluation

## 2017-11-14 ENCOUNTER — Inpatient Hospital Stay (HOSPITAL_COMMUNITY): Payer: Medicaid Other

## 2017-11-14 ENCOUNTER — Encounter (HOSPITAL_COMMUNITY): Payer: Self-pay | Admitting: Vascular Surgery

## 2017-11-14 LAB — BASIC METABOLIC PANEL
Anion gap: 8 (ref 5–15)
BUN: 7 mg/dL (ref 6–20)
CHLORIDE: 115 mmol/L — AB (ref 101–111)
CO2: 17 mmol/L — ABNORMAL LOW (ref 22–32)
CREATININE: 0.71 mg/dL (ref 0.44–1.00)
Calcium: 7.3 mg/dL — ABNORMAL LOW (ref 8.9–10.3)
GFR calc non Af Amer: >60 mL/min (ref >60)
Glucose, Bld: 98 mg/dL (ref 65–99)
POTASSIUM: 3.5 mmol/L (ref 3.5–5.1)
SODIUM: 140 mmol/L (ref 135–145)

## 2017-11-14 LAB — CBC
HCT: 25.6 % — ABNORMAL LOW (ref 36.0–46.0)
HCT: 26 % — ABNORMAL LOW (ref 36.0–46.0)
HEMOGLOBIN: 8.6 g/dL — AB (ref 12.0–15.0)
Hemoglobin: 8.7 g/dL — ABNORMAL LOW (ref 12.0–15.0)
MCH: 28.7 pg (ref 26.0–34.0)
MCH: 29.2 pg (ref 26.0–34.0)
MCHC: 33.6 g/dL (ref 30.0–36.0)
MCHC: 33.5 g/dL (ref 30.0–36.0)
MCV: 85.3 fL (ref 78.0–100.0)
MCV: 87.2 fL (ref 78.0–100.0)
Platelets: 108 10*3/uL — ABNORMAL LOW (ref 150–400)
Platelets: 93 10*3/uL — ABNORMAL LOW (ref 150–400)
RBC: 3 MIL/uL — AB (ref 3.87–5.11)
RBC: 2.98 MIL/uL — ABNORMAL LOW (ref 3.87–5.11)
RDW: 15.7 % — ABNORMAL HIGH (ref 11.5–15.5)
RDW: 15.9 % — ABNORMAL HIGH (ref 11.5–15.5)
WBC: 15.5 10*3/uL — AB (ref 4.0–10.5)
WBC: 18.8 10*3/uL — ABNORMAL HIGH (ref 4.0–10.5)

## 2017-11-14 LAB — SURGICAL PCR SCREEN
MRSA, PCR: NEGATIVE
STAPHYLOCOCCUS AUREUS: NEGATIVE

## 2017-11-14 LAB — MAGNESIUM: Magnesium: 1.5 mg/dL — ABNORMAL LOW (ref 1.7–2.4)

## 2017-11-14 LAB — LACTIC ACID, PLASMA: LACTIC ACID, VENOUS: 1.2 mmol/L (ref 0.5–1.9)

## 2017-11-14 LAB — PREPARE RBC (CROSSMATCH)

## 2017-11-14 MED ORDER — ACETAMINOPHEN 160 MG/5ML PO SOLN
650.0000 mg | Freq: Four times a day (QID) | ORAL | Status: DC | PRN
  Administered 2017-11-14 – 2017-11-16 (×7): 650 mg
  Filled 2017-11-14 (×8): qty 20.3

## 2017-11-14 MED ORDER — SODIUM CHLORIDE 0.9 % IV BOLUS (SEPSIS)
500.0000 mL | Freq: Once | INTRAVENOUS | Status: AC
  Administered 2017-11-14: 500 mL via INTRAVENOUS

## 2017-11-14 MED ORDER — SODIUM CHLORIDE 0.9 % IV SOLN
Freq: Once | INTRAVENOUS | Status: AC
  Administered 2017-11-14: 10:00:00 via INTRAVENOUS
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
## 2017-11-14 NOTE — Progress Notes (Signed)
Patient placed on previous full support settings due to HR of 140's and low 02 saturations. RN made aware. RT will continue to monitor.

## 2017-11-14 NOTE — Progress Notes (Addendum)
  Progress Note    11/14/2017 7:31 AM 1 Day Post-Op  Subjective:  Intubated/sedated  Tm 102.2 now 100.5  Vitals:   11/14/17 0500 11/14/17 0600  BP: 109/60 112/68  Pulse: (!) 102 (!) 122  Resp: 16 (!) 25  Temp: (!) 101.5 F (38.6 C) (!) 101.3 F (38.5 C)  SpO2: 96% 95%    Physical Exam: Cardiac:  regular Lungs:  intubated Incisions:  Left groin is soft without hematoma Extremities:  + right PT doppler signal   CBC    Component Value Date/Time   WBC 15.5 (H) 11/14/2017 0426   RBC 3.00 (L) 11/14/2017 0426   HGB 8.6 (L) 11/14/2017 0426   HCT 25.6 (L) 11/14/2017 0426   PLT 108 (L) 11/14/2017 0426   MCV 85.3 11/14/2017 0426   MCH 28.7 11/14/2017 0426   MCHC 33.6 11/14/2017 0426   RDW 15.7 (H) 11/14/2017 0426    BMET    Component Value Date/Time   NA 140 11/14/2017 0426   K 3.5 11/14/2017 0426   CL 115 (H) 11/14/2017 0426   CO2 17 (L) 11/14/2017 0426   GLUCOSE 98 11/14/2017 0426   BUN 7 11/14/2017 0426   CREATININE 0.71 11/14/2017 0426   CALCIUM 7.3 (L) 11/14/2017 0426   GFRNONAA >60 11/14/2017 0426   GFRAA >60 11/14/2017 0426    INR    Component Value Date/Time   INR 1.39 11/12/2017 2105     Intake/Output Summary (Last 24 hours) at 11/14/2017 0731 Last data filed at 11/14/2017 0600 Gross per 24 hour  Intake 5196.63 ml  Output 1325 ml  Net 3871.63 ml     Assessment:  32 y.o. female is s/p:  Abdominal aortogram with right lower extremity runoff  1 Day Post-Op  Plan: -pt with + right PT doppler signal -for OR with ortho today or tomorrow   Leontine Locket, PA-C [facility] [phone] 11/14/2017 7:31 AM  Wean Neo if possible to increase distal perfusion Agree with above.  Will sign off. Call if questions  Ruta Hinds, MD [facility] [place] Office: [phone] Pager: [phone]
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
## 2017-11-15 ENCOUNTER — Inpatient Hospital Stay (HOSPITAL_COMMUNITY): Payer: Medicaid Other

## 2017-11-15 LAB — CBC
HCT: 23.1 % — ABNORMAL LOW (ref 36.0–46.0)
HEMATOCRIT: 25.2 % — AB (ref 36.0–46.0)
Hemoglobin: 8.4 g/dL — ABNORMAL LOW (ref 12.0–15.0)
Hemoglobin: 7.9 g/dL — ABNORMAL LOW (ref 12.0–15.0)
MCH: 29.2 pg (ref 26.0–34.0)
MCH: 29.6 pg (ref 26.0–34.0)
MCHC: 33.3 g/dL (ref 30.0–36.0)
MCHC: 34.2 g/dL (ref 30.0–36.0)
MCV: 87.5 fL (ref 78.0–100.0)
MCV: 86.5 fL (ref 78.0–100.0)
PLATELETS: 89 10*3/uL — AB (ref 150–400)
PLATELETS: 86 10*3/uL — AB (ref 150–400)
RBC: 2.88 MIL/uL — ABNORMAL LOW (ref 3.87–5.11)
RBC: 2.67 MIL/uL — ABNORMAL LOW (ref 3.87–5.11)
RDW: 15.7 % — AB (ref 11.5–15.5)
RDW: 15.6 % — AB (ref 11.5–15.5)
WBC: 17.2 10*3/uL — AB (ref 4.0–10.5)
WBC: 12.4 10*3/uL — ABNORMAL HIGH (ref 4.0–10.5)

## 2017-11-15 LAB — BASIC METABOLIC PANEL
Anion gap: 6 (ref 5–15)
BUN: 5 mg/dL — ABNORMAL LOW (ref 6–20)
CALCIUM: 7.4 mg/dL — AB (ref 8.9–10.3)
CO2: 20 mmol/L — AB (ref 22–32)
CREATININE: 0.66 mg/dL (ref 0.44–1.00)
Chloride: 113 mmol/L — ABNORMAL HIGH (ref 101–111)
GFR calc Af Amer: >60 mL/min (ref >60)
GLUCOSE: 121 mg/dL — AB (ref 65–99)
Potassium: 4 mmol/L (ref 3.5–5.1)
Sodium: 139 mmol/L (ref 135–145)

## 2017-11-15 MED ORDER — PIPERACILLIN-TAZOBACTAM 3.375 G IVPB 30 MIN
3.3750 g | Freq: Once | INTRAVENOUS | Status: AC
  Administered 2017-11-15: 3.375 g via INTRAVENOUS
  Filled 2017-11-15: qty 50

## 2017-11-15 MED ORDER — PIPERACILLIN-TAZOBACTAM 3.375 G IVPB
3.3750 g | Freq: Three times a day (TID) | INTRAVENOUS | Status: DC
  Administered 2017-11-15 – 2017-11-21 (×17): 3.375 g via INTRAVENOUS
  Filled 2017-11-15 (×18): qty 50
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
## 2017-11-15 NOTE — Progress Notes (Signed)
Pharmacy Antibiotic Note  Kristine Mosley is a 32 y.o. female admitted on 11/12/2017 with Trama now suspected to have aspiration pneumonia.  Pharmacy has been consulted for zosyn dosing.  Plan: Zosyn 3.375g IV q8h (4 hour infusion).  Height: 5\' 4"  (162.6 cm) Weight: 126 lb 15.8 oz (57.6 kg) IBW/kg (Calculated) : 54.7  Temp (24hrs), Avg:100.1 F (37.8 C), Min:99 F (37.2 C), Max:101.1 F (38.4 C)  Recent Labs  Lab 11/12/17 2100 11/12/17 2102  11/12/17 2105 11/13/17 0310 11/13/17 0604 11/13/17 1437 11/14/17 0426 11/14/17 1525 11/15/17 0412  WBC  --   --    < > 26.6*  --  16.8* 11.8* 15.5* 18.8* 17.2*  CREATININE 0.70  --   --  0.78 0.78  --   --  0.71  --  0.66  LATICACIDVEN  --  3.26*  --   --   --  2.9*  --  1.2  --   --    < > = values in this interval not displayed.    Estimated Creatinine Clearance: 89.6 mL/min (by C-G formula based on SCr of 0.66 mg/dL).    No Known Allergies  Antimicrobials this admission: Zosyn 1/29 >>   Thank you for allowing pharmacy to be a part of this patient's care.  Jodean Lima Tonye Tancredi 11/15/2017 11:12 AM

## 2017-11-15 NOTE — Progress Notes (Signed)
Lavaged and suctioned patient during ventilator check.. Continue to retrieve a large amount of thick yellowish secretions including some chunks and plugs.  Rt will continue to monitor and assess.

## 2017-11-15 NOTE — Progress Notes (Signed)
Suctioned and lavaged patient with saline through ET tube retrieved a large thick plug followed by thick sputum.  Although patient was suctioned frequently throughout the night by RN and RT she requires lavaging at least once at shift and PRN.
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
## 2017-11-16 ENCOUNTER — Inpatient Hospital Stay (HOSPITAL_COMMUNITY): Payer: Medicaid Other

## 2017-11-16 LAB — CBC WITH DIFFERENTIAL/PLATELET
Basophils Absolute: 0 10*3/uL (ref 0.0–0.1)
Basophils Relative: 0 %
Eosinophils Absolute: 0.6 10*3/uL (ref 0.0–0.7)
Eosinophils Relative: 3 %
HEMATOCRIT: 23.9 % — AB (ref 36.0–46.0)
HEMOGLOBIN: 8 g/dL — AB (ref 12.0–15.0)
LYMPHS ABS: 1.8 10*3/uL (ref 0.7–4.0)
LYMPHS PCT: 10 %
MCH: 28.7 pg (ref 26.0–34.0)
MCHC: 33.5 g/dL (ref 30.0–36.0)
MCV: 85.7 fL (ref 78.0–100.0)
MONOS PCT: 7 %
Monocytes Absolute: 1.3 10*3/uL — ABNORMAL HIGH (ref 0.1–1.0)
NEUTROS PCT: 80 %
Neutro Abs: 14.3 10*3/uL — ABNORMAL HIGH (ref 1.7–7.7)
Platelets: 148 10*3/uL — ABNORMAL LOW (ref 150–400)
RBC: 2.79 MIL/uL — AB (ref 3.87–5.11)
RDW: 15.2 % (ref 11.5–15.5)
WBC: 18 10*3/uL — ABNORMAL HIGH (ref 4.0–10.5)

## 2017-11-16 LAB — TYPE AND SCREEN
ABO/RH(D): A POS
ANTIBODY SCREEN: NEGATIVE
UNIT DIVISION: 0
UNIT DIVISION: 0
Unit division: 0
Unit division: 0
Unit division: 0

## 2017-11-16 LAB — BASIC METABOLIC PANEL
Anion gap: 7 (ref 5–15)
CHLORIDE: 113 mmol/L — AB (ref 101–111)
CO2: 19 mmol/L — AB (ref 22–32)
CREATININE: 0.59 mg/dL (ref 0.44–1.00)
Calcium: 7.2 mg/dL — ABNORMAL LOW (ref 8.9–10.3)
GFR calc Af Amer: >60 mL/min (ref >60)
GFR calc non Af Amer: >60 mL/min (ref >60)
Glucose, Bld: 106 mg/dL — ABNORMAL HIGH (ref 65–99)
POTASSIUM: 3.4 mmol/L — AB (ref 3.5–5.1)
Sodium: 139 mmol/L (ref 135–145)

## 2017-11-16 LAB — GLUCOSE, CAPILLARY
GLUCOSE-CAPILLARY: 97 mg/dL (ref 65–99)
Glucose-Capillary: 115 mg/dL — ABNORMAL HIGH (ref 65–99)
Glucose-Capillary: 109 mg/dL — ABNORMAL HIGH (ref 65–99)
Glucose-Capillary: 104 mg/dL — ABNORMAL HIGH (ref 65–99)

## 2017-11-16 LAB — BPAM RBC
BLOOD PRODUCT EXPIRATION DATE: 201902082359
BLOOD PRODUCT EXPIRATION DATE: 201902142359
Blood Product Expiration Date: 201901312359
Blood Product Expiration Date: 201902012359
Blood Product Expiration Date: 201902092359
ISSUE DATE / TIME: 201901240939
ISSUE DATE / TIME: 201901240939
ISSUE DATE / TIME: 201901262022
ISSUE DATE / TIME: 201901280923
ISSUE DATE / TIME: 201901282005
UNIT TYPE AND RH: 6200
UNIT TYPE AND RH: 6200
Unit Type and Rh: 6200
Unit Type and Rh: 9500
Unit Type and Rh: 9500

## 2017-11-16 LAB — TRIGLYCERIDES: Triglycerides: 51 mg/dL (ref <150)

## 2017-11-16 LAB — PHOSPHORUS
PHOSPHORUS: 1.5 mg/dL — AB (ref 2.5–4.6)
Phosphorus: 1.4 mg/dL — ABNORMAL LOW (ref 2.5–4.6)

## 2017-11-16 LAB — PREPARE RBC (CROSSMATCH)

## 2017-11-16 LAB — MAGNESIUM
Magnesium: 1.7 mg/dL (ref 1.7–2.4)
Magnesium: 1.7 mg/dL (ref 1.7–2.4)

## 2017-11-16 MED ORDER — POTASSIUM CHLORIDE 10 MEQ/100ML IV SOLN
10.0000 meq | INTRAVENOUS | Status: AC
  Administered 2017-11-16 (×3): 10 meq via INTRAVENOUS
  Filled 2017-11-16 (×3): qty 100

## 2017-11-16 MED ORDER — SODIUM CHLORIDE 0.9% FLUSH: 10.0000 mL | INTRAVENOUS | Status: DC | PRN

## 2017-11-16 MED ORDER — SODIUM CHLORIDE 0.9 % IV SOLN
Freq: Once | INTRAVENOUS | Status: AC
  Administered 2017-11-16: 16:00:00 via INTRAVENOUS

## 2017-11-16 MED ORDER — SODIUM CHLORIDE 0.9% FLUSH
10.0000 mL | Freq: Two times a day (BID) | INTRAVENOUS | Status: DC
  Administered 2017-11-16 – 2017-11-19 (×7): 10 mL
  Administered 2017-11-19: 30 mL
  Administered 2017-11-20 (×2): 10 mL

## 2017-11-16 MED ORDER — PIVOT 1.5 CAL PO LIQD
1000.0000 mL | ORAL | Status: DC
  Administered 2017-11-16 – 2017-11-17 (×3): 1000 mL

## 2017-11-16 MED ORDER — PRO-STAT SUGAR FREE PO LIQD
30.0000 mL | Freq: Two times a day (BID) | ORAL | Status: DC
  Administered 2017-11-16: 30 mL
  Filled 2017-11-16: qty 30

## 2017-11-16 MED ORDER — CHLORHEXIDINE GLUCONATE CLOTH 2 % EX PADS
6.0000 | MEDICATED_PAD | Freq: Every day | CUTANEOUS | Status: DC
  Administered 2017-11-17 – 2017-11-19 (×4): 6 via TOPICAL

## 2017-11-16 MED ORDER — VITAL HIGH PROTEIN PO LIQD: 1000.0000 mL | ORAL | Status: DC

## 2017-11-16 MED ORDER — CHLORHEXIDINE GLUCONATE CLOTH 2 % EX PADS: 6.0000 | MEDICATED_PAD | Freq: Every day | CUTANEOUS | Status: DC
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
## 2017-11-16 NOTE — Progress Notes (Signed)
Assessed A-line due to small leak. Dressing removed, site cleaned, connectors tighted and applied new dressing. No complications. ABP 108/57.
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
## 2017-11-16 NOTE — Progress Notes (Signed)
Peripherally Inserted Central Catheter/Midline Placement  The IV Nurse has discussed with the patient and/or persons authorized to consent for the patient, the purpose of this procedure and the potential benefits and risks involved with this procedure.  The benefits include less needle sticks, lab draws from the catheter, and the patient may be discharged home with the catheter. Risks include, but not limited to, infection, bleeding, blood clot (thrombus formation), and puncture of an artery; nerve damage and irregular heartbeat and possibility to perform a PICC exchange if needed/ordered by physician.  Alternatives to this procedure were also discussed.  Bard Power PICC patient education guide, fact sheet on infection prevention and patient information card has been provided to patient /or left at bedside.    PICC/Midline Placement Documentation  PICC Triple Lumen 11/16/17 PICC Left Brachial 39 cm 1 cm (Active)  Indication for Insertion or Continuance of Line Prolonged intravenous therapies 11/16/2017  3:23 PM  Exposed Catheter (cm) 1 cm 11/16/2017  3:23 PM  Site Assessment Clean;Dry;Intact 11/16/2017  3:23 PM  Lumen #1 Status Flushed;Saline locked;Heparin locked (Peds) 11/16/2017  3:23 PM  Lumen #2 Status Flushed;Saline locked;Blood return noted 11/16/2017  3:23 PM  Lumen #3 Status Flushed;Saline locked;Blood return noted 11/16/2017  3:23 PM  Dressing Type Transparent;Securing device 11/16/2017  3:23 PM  Dressing Change Due 11/23/17 11/16/2017  3:23 PM  PICC placed by Claretha Cooper RN     Frances Maywood 11/16/2017, 3:26 PM

## 2017-11-17 ENCOUNTER — Inpatient Hospital Stay (HOSPITAL_COMMUNITY): Payer: Medicaid Other

## 2017-11-17 ENCOUNTER — Encounter (HOSPITAL_COMMUNITY): Admission: EM | Disposition: A | Discharge status: Rehabilitation Facility | Payer: Self-pay | Source: Home / Self Care

## 2017-11-17 ENCOUNTER — Inpatient Hospital Stay (HOSPITAL_COMMUNITY): Payer: Medicaid Other | Admitting: Certified Registered"

## 2017-11-17 HISTORY — PX: TIBIA IM NAIL INSERTION: SHX2516

## 2017-11-17 HISTORY — PX: ORIF TIBIA PLATEAU: SHX2132

## 2017-11-17 LAB — CBC
HEMATOCRIT: 27.3 % — AB (ref 36.0–46.0)
HEMATOCRIT: 29.4 % — AB (ref 36.0–46.0)
HEMOGLOBIN: 9.8 g/dL — AB (ref 12.0–15.0)
Hemoglobin: 9.3 g/dL — ABNORMAL LOW (ref 12.0–15.0)
MCH: 28.7 pg (ref 26.0–34.0)
MCH: 29.2 pg (ref 26.0–34.0)
MCHC: 34.1 g/dL (ref 30.0–36.0)
MCHC: 33.3 g/dL (ref 30.0–36.0)
MCV: 86 fL (ref 78.0–100.0)
MCV: 85.6 fL (ref 78.0–100.0)
PLATELETS: 126 10*3/uL — AB (ref 150–400)
Platelets: 140 10*3/uL — ABNORMAL LOW (ref 150–400)
RBC: 3.42 MIL/uL — ABNORMAL LOW (ref 3.87–5.11)
RBC: 3.19 MIL/uL — AB (ref 3.87–5.11)
RDW: 15 % (ref 11.5–15.5)
RDW: 15 % (ref 11.5–15.5)
WBC: 12.8 10*3/uL — ABNORMAL HIGH (ref 4.0–10.5)
WBC: 10.7 10*3/uL — AB (ref 4.0–10.5)

## 2017-11-17 LAB — PHOSPHORUS
PHOSPHORUS: 1.6 mg/dL — AB (ref 2.5–4.6)
PHOSPHORUS: 2.3 mg/dL — AB (ref 2.5–4.6)

## 2017-11-17 LAB — GLUCOSE, CAPILLARY
GLUCOSE-CAPILLARY: 103 mg/dL — AB (ref 65–99)
GLUCOSE-CAPILLARY: 120 mg/dL — AB (ref 65–99)
GLUCOSE-CAPILLARY: 125 mg/dL — AB (ref 65–99)
Glucose-Capillary: 123 mg/dL — ABNORMAL HIGH (ref 65–99)

## 2017-11-17 LAB — PREPARE RBC (CROSSMATCH)

## 2017-11-17 LAB — MAGNESIUM
MAGNESIUM: 2 mg/dL (ref 1.7–2.4)
Magnesium: 1.9 mg/dL (ref 1.7–2.4)

## 2017-11-17 SURGERY — INSERTION, INTRAMEDULLARY ROD, TIBIA
Anesthesia: General | Laterality: Right

## 2017-11-17 MED ORDER — FENTANYL CITRATE (PF) 250 MCG/5ML IJ SOLN
INTRAMUSCULAR | Status: AC
  Filled 2017-11-17: qty 5

## 2017-11-17 MED ORDER — PNEUMOCOCCAL VAC POLYVALENT 25 MCG/0.5ML IJ INJ
0.5000 mL | INJECTION | INTRAMUSCULAR | Status: AC
  Administered 2017-11-18: 0.5 mL via INTRAMUSCULAR
  Filled 2017-11-17: qty 0.5

## 2017-11-17 MED ORDER — PHENYLEPHRINE 40 MCG/ML (10ML) SYRINGE FOR IV PUSH (FOR BLOOD PRESSURE SUPPORT)
PREFILLED_SYRINGE | INTRAVENOUS | Status: AC
  Filled 2017-11-17: qty 10

## 2017-11-17 MED ORDER — ROCURONIUM BROMIDE 10 MG/ML (PF) SYRINGE
PREFILLED_SYRINGE | INTRAVENOUS | Status: AC
  Filled 2017-11-17: qty 5

## 2017-11-17 MED ORDER — MIDAZOLAM HCL 2 MG/2ML IJ SOLN
INTRAMUSCULAR | Status: AC
  Filled 2017-11-17: qty 2

## 2017-11-17 MED ORDER — PHENYLEPHRINE 40 MCG/ML (10ML) SYRINGE FOR IV PUSH (FOR BLOOD PRESSURE SUPPORT)
PREFILLED_SYRINGE | INTRAVENOUS | Status: DC | PRN
  Administered 2017-11-17 (×3): 80 ug via INTRAVENOUS

## 2017-11-17 MED ORDER — MIDAZOLAM HCL 2 MG/2ML IJ SOLN
INTRAMUSCULAR | Status: DC | PRN
  Administered 2017-11-17 (×2): 2 mg via INTRAVENOUS

## 2017-11-17 MED ORDER — FENTANYL CITRATE (PF) 250 MCG/5ML IJ SOLN
INTRAMUSCULAR | Status: DC | PRN
  Administered 2017-11-17 (×2): 50 ug via INTRAVENOUS
  Administered 2017-11-17: 100 ug via INTRAVENOUS
  Administered 2017-11-17: 50 ug via INTRAVENOUS
  Administered 2017-11-17: 100 ug via INTRAVENOUS
  Administered 2017-11-17 (×6): 50 ug via INTRAVENOUS
  Administered 2017-11-17: 100 ug via INTRAVENOUS

## 2017-11-17 MED ORDER — 0.9 % SODIUM CHLORIDE (POUR BTL) OPTIME
TOPICAL | Status: DC | PRN
  Administered 2017-11-17: 1000 mL

## 2017-11-17 MED ORDER — LACTATED RINGERS IV SOLN
INTRAVENOUS | Status: DC | PRN
  Administered 2017-11-17 (×2): via INTRAVENOUS

## 2017-11-17 MED ORDER — SODIUM CHLORIDE 0.9 % IV SOLN: Freq: Once | INTRAVENOUS | Status: DC

## 2017-11-17 MED ORDER — INFLUENZA VAC SPLIT QUAD 0.5 ML IM SUSY
0.5000 mL | PREFILLED_SYRINGE | INTRAMUSCULAR | Status: AC
  Administered 2017-11-18: 0.5 mL via INTRAMUSCULAR
  Filled 2017-11-17: qty 0.5

## 2017-11-17 MED ORDER — SUGAMMADEX SODIUM 200 MG/2ML IV SOLN
INTRAVENOUS | Status: AC
  Filled 2017-11-17: qty 2

## 2017-11-17 MED ORDER — ROCURONIUM BROMIDE 10 MG/ML (PF) SYRINGE
PREFILLED_SYRINGE | INTRAVENOUS | Status: DC | PRN
  Administered 2017-11-17 (×3): 30 mg via INTRAVENOUS
  Administered 2017-11-17: 50 mg via INTRAVENOUS
  Administered 2017-11-17: 70 mg via INTRAVENOUS
  Administered 2017-11-17: 50 mg via INTRAVENOUS

## 2017-11-17 MED ORDER — POTASSIUM CHLORIDE 10 MEQ/50ML IV SOLN
10.0000 meq | INTRAVENOUS | Status: AC
  Administered 2017-11-17 (×2): 10 meq via INTRAVENOUS
  Filled 2017-11-17 (×2): qty 50

## 2017-11-17 SURGICAL SUPPLY — 103 items
BANDAGE ACE 4X5 VEL STRL LF (GAUZE/BANDAGES/DRESSINGS) ×3 IMPLANT
BANDAGE ACE 6X5 VEL STRL LF (GAUZE/BANDAGES/DRESSINGS) ×3 IMPLANT
BANDAGE ESMARK 6X9 LF (GAUZE/BANDAGES/DRESSINGS) IMPLANT
BIT DRILL 100X2.5XANTM LCK (BIT) ×1 IMPLANT
BIT DRILL 3.8X6 NS (BIT) ×3 IMPLANT
BIT DRILL 4.4 NS (BIT) ×3 IMPLANT
BIT DRILL CAL (BIT) ×1 IMPLANT
BIT DRL 100X2.5XANTM LCK (BIT) ×1
BLADE CLIPPER SURG (BLADE) IMPLANT
BLADE SURG 10 STRL SS (BLADE) IMPLANT
BLADE SURG 15 STRL LF DISP TIS (BLADE) IMPLANT
BLADE SURG 15 STRL SS (BLADE)
BNDG COHESIVE 4X5 TAN STRL (GAUZE/BANDAGES/DRESSINGS) ×3 IMPLANT
BNDG ESMARK 6X9 LF (GAUZE/BANDAGES/DRESSINGS)
BNDG GAUZE ELAST 4 BULKY (GAUZE/BANDAGES/DRESSINGS) ×6 IMPLANT
BRUSH SCRUB SURG 4.25 DISP (MISCELLANEOUS) ×9 IMPLANT
CANISTER SUCT 3000ML PPV (MISCELLANEOUS) ×3 IMPLANT
CLOSURE WOUND 1/2 X4 (GAUZE/BANDAGES/DRESSINGS)
COVER SURGICAL LIGHT HANDLE (MISCELLANEOUS) ×3 IMPLANT
CUFF TOURNIQUET SINGLE 34IN LL (TOURNIQUET CUFF) IMPLANT
DRAPE C-ARM 42X72 X-RAY (DRAPES) ×3 IMPLANT
DRAPE C-ARMOR (DRAPES) ×3 IMPLANT
DRAPE HALF SHEET 40X57 (DRAPES) ×6 IMPLANT
DRAPE INCISE IOBAN 66X45 STRL (DRAPES) ×3 IMPLANT
DRAPE U-SHAPE 47X51 STRL (DRAPES) ×3 IMPLANT
DRILL BIT 2.5MM (BIT) ×2
DRILL BIT CAL (BIT) ×3
DRSG ADAPTIC 3X8 NADH LF (GAUZE/BANDAGES/DRESSINGS) IMPLANT
DRSG MEPITEL 4X7.2 (GAUZE/BANDAGES/DRESSINGS) ×12 IMPLANT
DRSG PAD ABDOMINAL 8X10 ST (GAUZE/BANDAGES/DRESSINGS) ×9 IMPLANT
ELECT REM PT RETURN 9FT ADLT (ELECTROSURGICAL) ×3
ELECTRODE REM PT RTRN 9FT ADLT (ELECTROSURGICAL) ×1 IMPLANT
GAUZE SPONGE 4X4 12PLY STRL (GAUZE/BANDAGES/DRESSINGS) ×6 IMPLANT
GLOVE BIO SURGEON STRL SZ 6 (GLOVE) ×3 IMPLANT
GLOVE BIO SURGEON STRL SZ7.5 (GLOVE) ×3 IMPLANT
GLOVE BIO SURGEON STRL SZ8 (GLOVE) ×3 IMPLANT
GLOVE BIO SURGEON STRL SZ8.5 (GLOVE) ×3 IMPLANT
GLOVE BIOGEL PI IND STRL 6 (GLOVE) ×1 IMPLANT
GLOVE BIOGEL PI IND STRL 7.5 (GLOVE) ×1 IMPLANT
GLOVE BIOGEL PI IND STRL 8 (GLOVE) ×1 IMPLANT
GLOVE BIOGEL PI INDICATOR 6 (GLOVE) ×2
GLOVE BIOGEL PI INDICATOR 7.5 (GLOVE) ×2
GLOVE BIOGEL PI INDICATOR 8 (GLOVE) ×2
GOWN STRL REUS W/ TWL LRG LVL3 (GOWN DISPOSABLE) ×2 IMPLANT
GOWN STRL REUS W/ TWL XL LVL3 (GOWN DISPOSABLE) ×1 IMPLANT
GOWN STRL REUS W/TWL LRG LVL3 (GOWN DISPOSABLE) ×4
GOWN STRL REUS W/TWL XL LVL3 (GOWN DISPOSABLE) ×2
GUIDEPIN 3.2X17.5 THRD DISP (PIN) ×3 IMPLANT
GUIDEWIRE BALL NOSE 80CM (WIRE) ×3 IMPLANT
IMMOBILIZER KNEE 22 UNIV (SOFTGOODS) IMPLANT
K-WIRE ACE 1.6X6 (WIRE) ×6
KIT BASIN OR (CUSTOM PROCEDURE TRAY) ×3 IMPLANT
KIT ROOM TURNOVER OR (KITS) ×3 IMPLANT
KWIRE ACE 1.6X6 (WIRE) ×2 IMPLANT
NAIL TIBIAL 8X36CM (Nail) ×3 IMPLANT
NDL SUT 6 .5 CRC .975X.05 MAYO (NEEDLE) IMPLANT
NEEDLE 18GX1X1/2 (RX/OR ONLY) (NEEDLE) ×3 IMPLANT
NEEDLE MAYO TAPER (NEEDLE)
NS IRRIG 1000ML POUR BTL (IV SOLUTION) ×3 IMPLANT
PACK ORTHO EXTREMITY (CUSTOM PROCEDURE TRAY) ×3 IMPLANT
PAD ARMBOARD 7.5X6 YLW CONV (MISCELLANEOUS) ×6 IMPLANT
PAD CAST 4YDX4 CTTN HI CHSV (CAST SUPPLIES) ×1 IMPLANT
PADDING CAST COTTON 4X4 STRL (CAST SUPPLIES) ×2
PADDING CAST COTTON 6X4 STRL (CAST SUPPLIES) ×3 IMPLANT
PLATE LOCK 3H STD RT PROX TIB (Plate) ×3 IMPLANT
SCREW ACE CAP BN (Screw) ×2 IMPLANT
SCREW ACECAP 40MM (Screw) ×3 IMPLANT
SCREW ACECAP 48MM (Screw) ×3 IMPLANT
SCREW BN OBLQ FT 54X4.5XST 2 (Screw) ×1 IMPLANT
SCREW CORTICAL 3.5MM 36MM (Screw) ×3 IMPLANT
SCREW CORTICAL 3.5MM 40MM (Screw) ×3 IMPLANT
SCREW LOCK CORT STAR 3.5X10 (Screw) ×3 IMPLANT
SCREW LOCK CORT STAR 3.5X12 (Screw) ×3 IMPLANT
SCREW LOCK CORT STAR 3.5X65 (Screw) ×6 IMPLANT
SCREW LOCK CORT STAR 3.5X70 (Screw) ×6 IMPLANT
SCREW LOCK CORT STAR 3.5X75 (Screw) ×3 IMPLANT
SCREW LP 3.5X65MM (Screw) ×3 IMPLANT
SCREW LP 3.5X70MM (Screw) ×3 IMPLANT
SCREW PROXIMAL DEPUY (Screw) ×4 IMPLANT
SCREW PRXML FT 45X5.5XLCK NS (Screw) ×2 IMPLANT
SPONGE LAP 18X18 X RAY DECT (DISPOSABLE) ×6 IMPLANT
STAPLER VISISTAT 35W (STAPLE) ×3 IMPLANT
STOCKINETTE IMPERVIOUS LG (DRAPES) ×3 IMPLANT
STRIP CLOSURE SKIN 1/2X4 (GAUZE/BANDAGES/DRESSINGS) IMPLANT
SUCTION FRAZIER HANDLE 10FR (MISCELLANEOUS) ×2
SUCTION TUBE FRAZIER 10FR DISP (MISCELLANEOUS) ×1 IMPLANT
SUT ETHILON 2 0 PSLX (SUTURE) ×6 IMPLANT
SUT ETHILON 3 0 PS 1 (SUTURE) IMPLANT
SUT PROLENE 0 CT 2 (SUTURE) IMPLANT
SUT VIC AB 0 CT1 27 (SUTURE) ×2
SUT VIC AB 0 CT1 27XBRD ANBCTR (SUTURE) ×1 IMPLANT
SUT VIC AB 1 CT1 27 (SUTURE)
SUT VIC AB 1 CT1 27XBRD ANBCTR (SUTURE) IMPLANT
SUT VIC AB 2-0 CT1 27 (SUTURE) ×2
SUT VIC AB 2-0 CT1 TAPERPNT 27 (SUTURE) ×1 IMPLANT
SYR 20CC LL (SYRINGE) ×3 IMPLANT
TOWEL OR 17X24 6PK STRL BLUE (TOWEL DISPOSABLE) ×3 IMPLANT
TOWEL OR 17X26 10 PK STRL BLUE (TOWEL DISPOSABLE) ×6 IMPLANT
TRAY FOLEY W/METER SILVER 16FR (SET/KITS/TRAYS/PACK) IMPLANT
TUBE CONNECTING 12'X1/4 (SUCTIONS) ×2
TUBE CONNECTING 12X1/4 (SUCTIONS) ×4 IMPLANT
WATER STERILE IRR 1000ML POUR (IV SOLUTION) IMPLANT
YANKAUER SUCT BULB TIP NO VENT (SUCTIONS) IMPLANT

## 2017-11-17 NOTE — Transfer of Care (Signed)
Immediate Anesthesia Transfer of Care Note  Patient: Kristine Mosley  Procedure(s) Performed: INTRAMEDULLARY (IM) NAIL TIBIAL (Right ) OPEN REDUCTION INTERNAL FIXATION (ORIF) TIBIAL PLATEAU (Right )  Patient Location: ICU  Anesthesia Type:General  Level of Consciousness: Patient remains intubated per anesthesia plan  Airway & Oxygen Therapy: Patient remains intubated per anesthesia plan and Patient placed on Ventilator (see vital sign flow sheet for setting)  Post-op Assessment: Report given to RN and Post -op Vital signs reviewed and stable  Post vital signs: Reviewed and stable  Last Vitals:  Vitals:   11/17/17 1200 11/17/17 1300  BP: (!) 128/94 130/82  Pulse: (!) 109 99  Resp: 16 16  Temp: 36.9 C 37 C  SpO2: 100% 100%    Last Pain:  Vitals:   11/17/17 0809  TempSrc: Bladder  PainSc:          Complications: No apparent anesthesia complications
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
## 2017-11-17 NOTE — Progress Notes (Signed)
No changes overnight.  The risks and benefits of surgery discussed were the possibility of infection, nerve injury, vessel injury, wound breakdown, arthritis, symptomatic hardware, DVT/ PE, loss of motion, malunion, nonunion, and need for further surgery among others.  Consent to proceed was obtained.  Kristine [place], MD [facility] [phone] (p)
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
## 2017-11-17 NOTE — Anesthesia Procedure Notes (Signed)
Date/Time: 11/17/2017 1:02 PM Performed by: Renato Shin, CRNA Induction Type: Inhalational induction with existing ETT Placement Confirmation: positive ETCO2
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
## 2017-11-17 NOTE — Progress Notes (Signed)
Orthopedic Tech Progress Note Patient Details:  Kristine Mosley [birth year] [id number]  Ortho Devices Type of Ortho Device: Prafo boot/shoe Ortho Device/Splint Location: RLE Ortho Device/Splint Interventions: Ordered   Post Interventions Patient Tolerated: Well Instructions Provided: Care of device   Braulio Bosch 11/17/2017, 4:45 PM

## 2017-11-17 NOTE — Anesthesia Postprocedure Evaluation (Signed)
Anesthesia Post Note  Patient: Kristine Mosley  Procedure(s) Performed: INTRAMEDULLARY (IM) NAIL TIBIAL (Right ) OPEN REDUCTION INTERNAL FIXATION (ORIF) TIBIAL PLATEAU (Right )     Patient location during evaluation: ICU Anesthesia Type: General Level of consciousness: sedated and patient remains intubated per anesthesia plan Pain management: pain level controlled Vital Signs Assessment: post-procedure vital signs reviewed and stable Respiratory status: patient remains intubated per anesthesia plan and patient on ventilator - see flowsheet for VS Cardiovascular status: blood pressure returned to baseline and stable Postop Assessment: no apparent nausea or vomiting Anesthetic complications: no    Last Vitals:  Vitals:   11/17/17 1727 11/17/17 1800  BP: (!) 126/98 (!) 154/109  Pulse:  (!) 132  Resp:  16  Temp:  36.5 C  SpO2: 100% 100%    Last Pain:  Vitals:   11/17/17 0809  TempSrc: Bladder  PainSc:                  Seleta Rhymes. Christophe Rising

## 2017-11-18 ENCOUNTER — Encounter (HOSPITAL_COMMUNITY): Payer: Self-pay | Admitting: Orthopedic Surgery

## 2017-11-18 ENCOUNTER — Inpatient Hospital Stay (HOSPITAL_COMMUNITY): Payer: Medicaid Other

## 2017-11-18 LAB — BASIC METABOLIC PANEL
ANION GAP: 12 (ref 5–15)
Anion gap: 8 (ref 5–15)
BUN: 8 mg/dL (ref 6–20)
BUN: 7 mg/dL (ref 6–20)
CALCIUM: 7.5 mg/dL — AB (ref 8.9–10.3)
CALCIUM: 7.9 mg/dL — AB (ref 8.9–10.3)
CHLORIDE: 107 mmol/L (ref 101–111)
CO2: 26 mmol/L (ref 22–32)
CO2: 27 mmol/L (ref 22–32)
CREATININE: 0.58 mg/dL (ref 0.44–1.00)
Chloride: 100 mmol/L — ABNORMAL LOW (ref 101–111)
Creatinine, Ser: 0.51 mg/dL (ref 0.44–1.00)
GFR calc non Af Amer: >60 mL/min (ref >60)
Glucose, Bld: 134 mg/dL — ABNORMAL HIGH (ref 65–99)
Glucose, Bld: 121 mg/dL — ABNORMAL HIGH (ref 65–99)
POTASSIUM: 3 mmol/L — AB (ref 3.5–5.1)
Potassium: 3.5 mmol/L (ref 3.5–5.1)
SODIUM: 141 mmol/L (ref 135–145)
SODIUM: 139 mmol/L (ref 135–145)

## 2017-11-18 LAB — POCT I-STAT 3, ART BLOOD GAS (G3+)
ACID-BASE EXCESS: 3 mmol/L — AB (ref 0.0–2.0)
BICARBONATE: 28.1 mmol/L — AB (ref 20.0–28.0)
O2 SAT: 99 %
PH ART: 7.392 (ref 7.350–7.450)
PO2 ART: 150 mmHg — AB (ref 83.0–108.0)
Patient temperature: 98.6
TCO2: 29 mmol/L (ref 22–32)
pCO2 arterial: 46.1 mmHg (ref 32.0–48.0)

## 2017-11-18 LAB — CBC
HCT: 26.3 % — ABNORMAL LOW (ref 36.0–46.0)
HEMOGLOBIN: 8.9 g/dL — AB (ref 12.0–15.0)
MCH: 29.5 pg (ref 26.0–34.0)
MCHC: 33.8 g/dL (ref 30.0–36.0)
MCV: 87.1 fL (ref 78.0–100.0)
Platelets: 167 10*3/uL (ref 150–400)
RBC: 3.02 MIL/uL — AB (ref 3.87–5.11)
RDW: 15.4 % (ref 11.5–15.5)
WBC: 8.2 10*3/uL (ref 4.0–10.5)

## 2017-11-18 LAB — GLUCOSE, CAPILLARY
GLUCOSE-CAPILLARY: 120 mg/dL — AB (ref 65–99)
GLUCOSE-CAPILLARY: 131 mg/dL — AB (ref 65–99)
GLUCOSE-CAPILLARY: 119 mg/dL — AB (ref 65–99)
GLUCOSE-CAPILLARY: 107 mg/dL — AB (ref 65–99)
GLUCOSE-CAPILLARY: 97 mg/dL (ref 65–99)
Glucose-Capillary: 134 mg/dL — ABNORMAL HIGH (ref 65–99)

## 2017-11-18 MED ORDER — FUROSEMIDE 10 MG/ML IJ SOLN
40.0000 mg | Freq: Once | INTRAMUSCULAR | Status: AC
  Administered 2017-11-18: 40 mg via INTRAVENOUS
  Filled 2017-11-18: qty 4

## 2017-11-18 MED ORDER — ALTEPLASE 2 MG IJ SOLR
2.0000 mg | Freq: Once | INTRAMUSCULAR | Status: AC
  Administered 2017-11-18: 2 mg
  Filled 2017-11-18: qty 2

## 2017-11-18 MED ORDER — HYDROMORPHONE HCL 1 MG/ML IJ SOLN
0.5000 mg | INTRAMUSCULAR | Status: DC | PRN
  Administered 2017-11-18 (×3): 1 mg via INTRAVENOUS
  Administered 2017-11-18 – 2017-11-20 (×5): 0.5 mg via INTRAVENOUS
  Filled 2017-11-18 (×3): qty 0.5
  Filled 2017-11-18: qty 1
  Filled 2017-11-18 (×2): qty 0.5
  Filled 2017-11-18: qty 1
  Filled 2017-11-18: qty 0.5
  Filled 2017-11-18 (×2): qty 1

## 2017-11-18 NOTE — Progress Notes (Signed)
PT Cancellation Note  Patient Details Name: Kristine Mosley MRN: [id number] DOB: 12/05/1985   Cancelled Treatment:    Reason Eval/Treat Not Completed: Medical issues which prohibited therapy(Extubated this am and sleepy.  Will return 2/2.  )   Denice Paradise 11/18/2017, 3:49 PM [facility] Acute Rehabilitation [phone] (pager)
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
## 2017-11-18 NOTE — Procedures (Signed)
Extubation Procedure Note  Patient Details:   Name: Kristine Mosley DOB: 11-07-1985 MRN: [id number]   Airway Documentation:  Airway 7.5 mm (Active)  Secured at (cm) 24 cm 11/18/2017  7:43 AM  Measured From Lips 11/18/2017  7:43 AM  [place] 11/18/2017  7:43 AM  Secured By [employer] 11/18/2017  7:43 AM  Tube Holder Repositioned Yes 11/18/2017  7:43 AM  Cuff Pressure (cm H2O) 24 cm H2O 11/18/2017  7:43 AM  Site Condition Dry 11/18/2017  7:43 AM   Positive cuff leak prior to extubation.   Evaluation  O2 sats: stable throughout Complications: No apparent complications Patient did tolerate procedure well. Bilateral Breath Sounds: Diminished, Clear   No  RN at bedside during extubation. VS stable throughout and no signs or stridor or distress. Placed pt on 3L [place]. Pt still sleepy and unable to speak at this time.  Elwin Mocha 11/18/2017, 10:09 AM

## 2017-11-18 NOTE — Progress Notes (Signed)
Follow up - Trauma and Critical Care  Patient Details:    Kristine Mosley is an 32 y.o. female.  Lines/tubes : Airway 7.5 mm (Active)  Secured at (cm) 24 cm 11/18/2017  7:43 AM  Measured From Lips 11/18/2017  7:43 AM  [place] 11/18/2017  7:43 AM  Secured By [employer] 11/18/2017  7:43 AM  Tube Holder Repositioned Yes 11/18/2017  7:43 AM  Cuff Pressure (cm H2O) 24 cm H2O 11/18/2017  7:43 AM  Site Condition Dry 11/18/2017  7:43 AM     PICC Triple Lumen 11/16/17 PICC Left Brachial 39 cm 1 cm (Active)  Indication for Insertion or Continuance of Line Prolonged intravenous therapies 11/18/2017  8:00 AM  Exposed Catheter (cm) 1 cm 11/16/2017  3:23 PM  Site Assessment Clean;Dry;Intact 11/18/2017  8:00 AM  Lumen #1 Status Infusing 11/18/2017  8:00 AM  Lumen #2 Status Infusing 11/18/2017  8:00 AM  Lumen #3 Status In-line blood sampling system in place 11/18/2017  8:00 AM  Dressing Type Transparent;Occlusive;Securing device 11/18/2017  8:00 AM  Dressing Status Clean;Dry;Intact;Antimicrobial disc in place 11/18/2017  8:00 AM  Line Care Connections checked and tightened 11/18/2017  8:00 AM  Dressing Change Due 11/23/17 11/18/2017  8:00 AM     Arterial Line 11/13/17 Right Radial (Active)  Site Assessment Clean;Dry;Intact 11/18/2017  8:00 AM  Line Status Pulsatile blood flow 11/18/2017  8:00 AM  Art Line Waveform Appropriate 11/18/2017  8:00 AM  Art Line Interventions Zeroed and calibrated;Leveled;Connections checked and tightened;Flushed per protocol;Line pulled back 11/18/2017  8:00 AM  Color/Movement/Sensation Capillary refill less than 3 sec 11/18/2017  8:00 AM  Dressing Type Transparent;Occlusive 11/18/2017  8:00 AM  Dressing Status New drainage 11/18/2017  2:00 AM  Interventions New dressing;Antimicrobial disc changed 11/18/2017  2:00 AM  Dressing Change Due 11/25/17 11/18/2017  8:00 AM     NG/OG Tube Orogastric 18 Fr. Center mouth Xray;Aucultation (Active)  External Length of Tube (cm) - (if applicable)  67 cm 02/08/5360  8:00 PM  Site Assessment Clean;Dry;Intact 11/18/2017  8:00 AM  Ongoing Placement Verification Auscultation;No change in cm markings or external length of tube from initial placement;No change in respiratory status;No acute changes, not attributed to clinical condition 11/18/2017  8:00 AM  Status Infusing tube feed 11/18/2017  8:00 AM  Amount of suction 110 mmHg 11/15/2017  8:00 PM  Drainage Appearance Brown 11/16/2017  8:00 AM  Intake (mL) 60 mL 11/16/2017 10:29 PM  Output (mL) 150 mL 11/17/2017  6:00 PM     Urethral Catheter Marylen Ponto, RN Latex;Straight-tip;Temperature probe 16 Fr. (Active)  Indication for Insertion or Continuance of Catheter Peri-operative use for selective surgical procedure;Other (comment) 11/18/2017  8:00 AM  Site Assessment Clean;Intact 11/18/2017  8:00 AM  Catheter Maintenance Bag below level of bladder;Catheter secured;Drainage bag/tubing not touching floor;Insertion date on drainage bag;No dependent loops;Seal intact 11/18/2017  8:00 AM  Collection Container Standard drainage bag 11/18/2017  8:00 AM  Securement Method Securing device (Describe) 11/18/2017  8:00 AM  Urinary Catheter Interventions Unclamped 11/18/2017  8:00 AM  Output (mL) 250 mL 11/18/2017  8:30 AM    Microbiology/Sepsis markers: Results for orders placed or performed during the hospital encounter of 11/12/17  MRSA PCR Screening     Status: None   Collection Time: 11/13/17  1:07 AM  Result Value Ref Range Status   MRSA by PCR NEGATIVE NEGATIVE Final    Comment:        The GeneXpert MRSA Assay (FDA approved for  NASAL specimens only), is one component of a comprehensive MRSA colonization surveillance program. It is not intended to diagnose MRSA infection nor to guide or monitor treatment for MRSA infections.   Surgical pcr screen     Status: None   Collection Time: 11/14/17  7:38 PM  Result Value Ref Range Status   MRSA, PCR NEGATIVE NEGATIVE Final   Staphylococcus aureus NEGATIVE  NEGATIVE Final    Comment: (NOTE) The Xpert SA Assay (FDA approved for NASAL specimens in patients 17 years of age and older), is one component of a comprehensive surveillance program. It is not intended to diagnose infection nor to guide or monitor treatment.     Anti-infectives:  Anti-infectives (From admission, onward)   Start     Dose/Rate Route Frequency Ordered Stop   11/15/17 1800  piperacillin-tazobactam (ZOSYN) IVPB 3.375 g     3.375 g 12.5 mL/hr over 240 Minutes Intravenous Every 8 hours 11/15/17 1110     11/15/17 1115  piperacillin-tazobactam (ZOSYN) IVPB 3.375 g     3.375 g 100 mL/hr over 30 Minutes Intravenous  Once 11/15/17 1110 11/15/17 1310      Best Practice/Protocols:  VTE Prophylaxis: Lovenox (prophylaxtic dose) and Mechanical GI Prophylaxis: Proton Pump Inhibitor Continous Sedation  Consults: Treatment Team:  Altamese Trenton, MD    Events:  Subjective:    Overnight Issues: Weaning as of this morning.  Doing well.  Objective:  Vital signs for last 24 hours: Temp:  [97.7 F (36.5 C)-100.9 F (38.3 C)] 99.9 F (37.7 C) (02/01 0900) Pulse Rate:  [99-133] 125 (02/01 0900) Resp:  [15-31] 21 (02/01 0900) BP: (115-154)/(65-109) 136/91 (02/01 0900) SpO2:  [98 %-100 %] 100 % (02/01 0900) Arterial Line BP: (82-172)/(64-106) 145/86 (02/01 0900) FiO2 (%):  [40 %] 40 % (02/01 0800) Weight:  [67.4 kg (148 lb 9.4 oz)] 67.4 kg (148 lb 9.4 oz) (02/01 0500)  Hemodynamic parameters for last 24 hours:    Intake/Output from previous day: 01/31 0701 - 02/01 0700 In: 4563.2 [P.O.:200; I.V.:3593.2; NG/GT:520; IV Piggyback:250] Out: 3810 [Urine:3225; Emesis/NG output:475; Blood:75]  Intake/Output this shift: Total I/O In: 297.1 [I.V.:243.8; NG/GT:53.3] Out: 250 [Urine:250]  Vent settings for last 24 hours: Vent Mode: PSV;CPAP FiO2 (%):  [40 %] 40 % Set Rate:  [16 bmp] 16 bmp Vt Set:  [500 mL] 500 mL PEEP:  [5 cmH20] 5 cmH20 Pressure Support:  [5 cmH20] 5  cmH20 Plateau Pressure:  [11 cmH20-19 cmH20] 14 cmH20  Physical Exam:  General: no respiratory distress and still somnolent with sedation going, but no distress Neuro: nonfocal exam, RASS 0 and RASS -1 HEENT/Neck: ETT WNL  and working to get the patient extubated Resp: clear to auscultation bilaterally and CXR shows a slightly improving RLL infiltrate CVS: Sinus tadchycardia GI: soft, nontender, BS WNL, no r/g and Had been tolerating tube feedings well. Extremities: edema 3+  Results for orders placed or performed during the hospital encounter of 11/12/17 (from the past 24 hour(s))  Glucose, capillary     Status: Abnormal   Collection Time: 11/17/17 11:14 AM  Result Value Ref Range   Glucose-Capillary 103 (H) 65 - 99 mg/dL  Prepare RBC     Status: None   Collection Time: 11/17/17  1:24 PM  Result Value Ref Range   Order Confirmation ORDER PROCESSED BY BLOOD BANK   Magnesium     Status: None   Collection Time: 11/17/17  6:02 PM  Result Value Ref Range   Magnesium 2.0 1.7 - 2.4 mg/dL  Phosphorus     Status: Abnormal   Collection Time: 11/17/17  6:02 PM  Result Value Ref Range   Phosphorus 2.3 (L) 2.5 - 4.6 mg/dL  Glucose, capillary     Status: Abnormal   Collection Time: 11/17/17  9:01 PM  Result Value Ref Range   Glucose-Capillary 123 (H) 65 - 99 mg/dL  Glucose, capillary     Status: Abnormal   Collection Time: 11/17/17 11:57 PM  Result Value Ref Range   Glucose-Capillary 125 (H) 65 - 99 mg/dL  Glucose, capillary     Status: Abnormal   Collection Time: 11/18/17  4:34 AM  Result Value Ref Range   Glucose-Capillary 120 (H) 65 - 99 mg/dL   Comment 1 Notify RN    Comment 2 Document in Chart   CBC     Status: Abnormal   Collection Time: 11/18/17  5:00 AM  Result Value Ref Range   WBC 8.2 4.0 - 10.5 K/uL   RBC 3.02 (L) 3.87 - 5.11 MIL/uL   Hemoglobin 8.9 (L) 12.0 - 15.0 g/dL   HCT 26.3 (L) 36.0 - 46.0 %   MCV 87.1 78.0 - 100.0 fL   MCH 29.5 26.0 - 34.0 pg   MCHC 33.8 30.0  - 36.0 g/dL   RDW 15.4 11.5 - 15.5 %   Platelets 167 150 - 400 K/uL  Basic metabolic panel     Status: Abnormal   Collection Time: 11/18/17  5:00 AM  Result Value Ref Range   Sodium 141 135 - 145 mmol/L   Potassium 3.5 3.5 - 5.1 mmol/L   Chloride 107 101 - 111 mmol/L   CO2 26 22 - 32 mmol/L   Glucose, Bld 134 (H) 65 - 99 mg/dL   BUN 8 6 - 20 mg/dL   Creatinine, Ser 0.58 0.44 - 1.00 mg/dL   Calcium 7.5 (L) 8.9 - 10.3 mg/dL   GFR calc non Af Amer >60 >60 mL/min   GFR calc Af Amer >60 >60 mL/min   Anion gap 8 5 - 15  Glucose, capillary     Status: Abnormal   Collection Time: 11/18/17  7:37 AM  Result Value Ref Range   Glucose-Capillary 134 (H) 65 - 99 mg/dL     Assessment/Plan:   NEURO  Altered Mental Status:  agitation and sedation   Plan: Minimizing sedation fro extubation  PULM  Atelectasis/collapse (focal and RLL)   Plan: Extubate, ABG  CARDIO  Sinus Tachycardia   Plan: No specific treatment  RENAL  Hypervolemia   Plan: Needs diuresis  GI  No specific issues   Plan: CPM.  Hold tube feedings for extubation  ID  No known infectious problems   Plan: CPM  HEME  Anemia acute blood loss anemia)   Plan: No need for transfusion  ENDO No specific issues   Plan: CPM  Global Issues  Probably extubate today.  Will diurese because of fluid overload.  Wean sedation as we move towards extubation.  Discontinue arterial line after ABG.    LOS: 6 days   Additional comments:I reviewed the patient's new clinical lab test results. cbc/bmet/abg, I reviewed the patients new imaging test results. cxr and I have discussed and reviewed with family members patient's sister  Critical Care Total Time*: 23 Minutes  Judeth Horn 11/18/2017  *Care during the described time interval was provided by me and/or other providers on the critical care team.  I have reviewed this patient's available data, including medical history, events of note, physical  examination and test results as part of my  evaluation.

## 2017-11-19 ENCOUNTER — Encounter (HOSPITAL_COMMUNITY): Payer: Self-pay

## 2017-11-19 ENCOUNTER — Inpatient Hospital Stay (HOSPITAL_COMMUNITY): Payer: Medicaid Other

## 2017-11-19 LAB — CBC WITH DIFFERENTIAL/PLATELET
Basophils Absolute: 0 10*3/uL (ref 0.0–0.1)
Basophils Relative: 0 %
EOS ABS: 0.1 10*3/uL (ref 0.0–0.7)
Eosinophils Relative: 1 %
HCT: 26.5 % — ABNORMAL LOW (ref 36.0–46.0)
Hemoglobin: 9 g/dL — ABNORMAL LOW (ref 12.0–15.0)
LYMPHS ABS: 1.2 10*3/uL (ref 0.7–4.0)
LYMPHS PCT: 14 %
MCH: 29.2 pg (ref 26.0–34.0)
MCHC: 34 g/dL (ref 30.0–36.0)
MCV: 86 fL (ref 78.0–100.0)
MONOS PCT: 12 %
Monocytes Absolute: 1 10*3/uL (ref 0.1–1.0)
Neutro Abs: 6.4 10*3/uL (ref 1.7–7.7)
Neutrophils Relative %: 73 %
Platelets: 224 10*3/uL (ref 150–400)
RBC: 3.08 MIL/uL — AB (ref 3.87–5.11)
RDW: 14.9 % (ref 11.5–15.5)
WBC: 8.7 10*3/uL (ref 4.0–10.5)

## 2017-11-19 LAB — BASIC METABOLIC PANEL
Anion gap: 11 (ref 5–15)
BUN: 9 mg/dL (ref 6–20)
CO2: 25 mmol/L (ref 22–32)
CREATININE: 0.59 mg/dL (ref 0.44–1.00)
Calcium: 8 mg/dL — ABNORMAL LOW (ref 8.9–10.3)
Chloride: 103 mmol/L (ref 101–111)
GFR calc Af Amer: >60 mL/min (ref >60)
GFR calc non Af Amer: >60 mL/min (ref >60)
GLUCOSE: 127 mg/dL — AB (ref 65–99)
POTASSIUM: 3.3 mmol/L — AB (ref 3.5–5.1)
SODIUM: 139 mmol/L (ref 135–145)

## 2017-11-19 LAB — GLUCOSE, CAPILLARY
GLUCOSE-CAPILLARY: 107 mg/dL — AB (ref 65–99)
GLUCOSE-CAPILLARY: 102 mg/dL — AB (ref 65–99)
GLUCOSE-CAPILLARY: 92 mg/dL (ref 65–99)
Glucose-Capillary: 106 mg/dL — ABNORMAL HIGH (ref 65–99)
Glucose-Capillary: 84 mg/dL (ref 65–99)

## 2017-11-19 MED ORDER — THIAMINE HCL 100 MG/ML IJ SOLN
100.0000 mg | Freq: Every day | INTRAMUSCULAR | Status: DC
  Administered 2017-11-19: 100 mg via INTRAVENOUS
  Filled 2017-11-19 (×2): qty 2

## 2017-11-19 MED ORDER — VITAMIN B-1 100 MG PO TABS
100.0000 mg | ORAL_TABLET | Freq: Every day | ORAL | Status: DC
  Administered 2017-11-20 – 2017-11-24 (×5): 100 mg via ORAL
  Filled 2017-11-19 (×5): qty 1

## 2017-11-19 MED ORDER — LORAZEPAM 2 MG/ML IJ SOLN: 1.0000 mg | Freq: Four times a day (QID) | INTRAMUSCULAR | Status: AC | PRN

## 2017-11-19 MED ORDER — METOPROLOL TARTRATE 5 MG/5ML IV SOLN
5.0000 mg | Freq: Once | INTRAVENOUS | Status: AC
  Administered 2017-11-19: 5 mg via INTRAVENOUS
  Filled 2017-11-19: qty 5

## 2017-11-19 MED ORDER — LORAZEPAM 1 MG PO TABS: 1.0000 mg | ORAL_TABLET | Freq: Four times a day (QID) | ORAL | Status: AC | PRN

## 2017-11-19 MED ORDER — FOLIC ACID 1 MG PO TABS
1.0000 mg | ORAL_TABLET | Freq: Every day | ORAL | Status: DC
  Administered 2017-11-20 – 2017-11-24 (×5): 1 mg via ORAL
  Filled 2017-11-19 (×5): qty 1

## 2017-11-19 MED ORDER — LORAZEPAM 2 MG/ML IJ SOLN
0.0000 mg | Freq: Two times a day (BID) | INTRAMUSCULAR | Status: AC
  Administered 2017-11-22: 2 mg via INTRAVENOUS
  Filled 2017-11-19: qty 1

## 2017-11-19 MED ORDER — LORAZEPAM 2 MG/ML IJ SOLN
0.0000 mg | Freq: Four times a day (QID) | INTRAMUSCULAR | Status: AC
  Administered 2017-11-19: 2 mg via INTRAVENOUS
  Filled 2017-11-19: qty 1

## 2017-11-19 MED ORDER — ADULT MULTIVITAMIN W/MINERALS CH
1.0000 | ORAL_TABLET | Freq: Every day | ORAL | Status: DC
  Administered 2017-11-20 – 2017-11-24 (×5): 1 via ORAL
  Filled 2017-11-19 (×5): qty 1

## 2017-11-19 NOTE — Progress Notes (Signed)
Alerted on call MD about increased tachycardia (130-140). One time 5mg  dose of Lopressor ordered.
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
## 2017-11-19 NOTE — Progress Notes (Signed)
Rehab Admissions Coordinator Note:  Patient was screened by Cleatrice Burke for appropriateness for an Inpatient Acute Rehab Consult per therapy recommendations.  At this time, we are recommending Inpatient Rehab consult. Please place order for consult.  Cleatrice Burke 11/19/2017, 11:23 AM  I can be reached at [phone].

## 2017-11-19 NOTE — Progress Notes (Signed)
Notified MD on call in regards to patient experiencing hallucinations.  CIWA protocol was ordered.
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
## 2017-11-19 NOTE — Progress Notes (Signed)
SLP Cancellation Note  Patient Details Name: Kristine Mosley MRN: [id number] DOB: [birth year]   Cancelled treatment:       Reason Eval/Treat Not Completed: Fatigue/lethargy limiting ability to participate. Received Ativan this am as part of CIWA protocol. Not alert enough for evaluation/pos. RN to page this SLP if patient becomes more appropriate this afternoon.   Gabriel Rainwater MA, CCC-SLP [phone]    [name] Meryl 11/19/2017, 8:50 AM

## 2017-11-20 LAB — BPAM RBC
BLOOD PRODUCT EXPIRATION DATE: 201902092359
BLOOD PRODUCT EXPIRATION DATE: 201902182359
Blood Product Expiration Date: 201902062359
Blood Product Expiration Date: 201902182359
ISSUE DATE / TIME: 201901301613
ISSUE DATE / TIME: 201901301830
ISSUE DATE / TIME: 201901311327
ISSUE DATE / TIME: 201901311327
UNIT TYPE AND RH: 600
UNIT TYPE AND RH: 6200
Unit Type and Rh: 6200
Unit Type and Rh: 6200

## 2017-11-20 LAB — TYPE AND SCREEN
ABO/RH(D): A POS
ANTIBODY SCREEN: NEGATIVE
UNIT DIVISION: 0
UNIT DIVISION: 0
Unit division: 0
Unit division: 0

## 2017-11-20 LAB — GLUCOSE, CAPILLARY
GLUCOSE-CAPILLARY: 100 mg/dL — AB (ref 65–99)
Glucose-Capillary: 101 mg/dL — ABNORMAL HIGH (ref 65–99)
Glucose-Capillary: 128 mg/dL — ABNORMAL HIGH (ref 65–99)
Glucose-Capillary: 82 mg/dL (ref 65–99)
Glucose-Capillary: 97 mg/dL (ref 65–99)

## 2017-11-20 MED ORDER — MORPHINE SULFATE (PF) 4 MG/ML IV SOLN
1.0000 mg | INTRAVENOUS | Status: DC | PRN
  Administered 2017-11-20 – 2017-11-21 (×4): 1 mg via INTRAVENOUS
  Filled 2017-11-20 (×4): qty 1

## 2017-11-20 MED ORDER — RESOURCE THICKENUP CLEAR PO POWD
ORAL | Status: DC | PRN
  Filled 2017-11-20: qty 125
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
## 2017-11-20 NOTE — Plan of Care (Signed)
Pt able to work with PT/OT today.
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
## 2017-11-20 NOTE — Progress Notes (Signed)
Spoke with Dr. Ninfa Linden, patient requesting to remove C-collar.  CT neck negative, patient has full ROM of neck and denies any pain or discomfort.  Verbal order from Dr. Ninfa Linden, "OK to remove neck collar."

## 2017-11-21 ENCOUNTER — Encounter (HOSPITAL_COMMUNITY): Payer: Self-pay | Admitting: Physical Medicine and Rehabilitation

## 2017-11-21 DIAGNOSIS — T148XXA Other injury of unspecified body region, initial encounter: Secondary | ICD-10-CM

## 2017-11-21 DIAGNOSIS — S82401D Unspecified fracture of shaft of right fibula, subsequent encounter for closed fracture with routine healing: Secondary | ICD-10-CM

## 2017-11-21 DIAGNOSIS — Z720 Tobacco use: Secondary | ICD-10-CM

## 2017-11-21 DIAGNOSIS — S82141D Displaced bicondylar fracture of right tibia, subsequent encounter for closed fracture with routine healing: Secondary | ICD-10-CM

## 2017-11-21 DIAGNOSIS — S32424D Nondisplaced fracture of posterior wall of right acetabulum, subsequent encounter for fracture with routine healing: Secondary | ICD-10-CM

## 2017-11-21 DIAGNOSIS — D62 Acute posthemorrhagic anemia: Secondary | ICD-10-CM

## 2017-11-21 DIAGNOSIS — E876 Hypokalemia: Secondary | ICD-10-CM

## 2017-11-21 DIAGNOSIS — R52 Pain, unspecified: Secondary | ICD-10-CM

## 2017-11-21 DIAGNOSIS — F1010 Alcohol abuse, uncomplicated: Secondary | ICD-10-CM

## 2017-11-21 DIAGNOSIS — S82201D Unspecified fracture of shaft of right tibia, subsequent encounter for closed fracture with routine healing: Secondary | ICD-10-CM

## 2017-11-21 LAB — GLUCOSE, CAPILLARY
Glucose-Capillary: 128 mg/dL — ABNORMAL HIGH (ref 65–99)
Glucose-Capillary: 89 mg/dL (ref 65–99)
Glucose-Capillary: 102 mg/dL — ABNORMAL HIGH (ref 65–99)

## 2017-11-21 MED ORDER — OXYCODONE HCL 5 MG PO TABS
5.0000 mg | ORAL_TABLET | ORAL | Status: DC | PRN
  Administered 2017-11-21 (×2): 5 mg via ORAL
  Administered 2017-11-21 – 2017-11-23 (×4): 10 mg via ORAL
  Administered 2017-11-23: 5 mg via ORAL
  Administered 2017-11-23: 10 mg via ORAL
  Administered 2017-11-23 – 2017-11-24 (×3): 5 mg via ORAL
  Filled 2017-11-21 (×2): qty 1
  Filled 2017-11-21: qty 2
  Filled 2017-11-21: qty 1
  Filled 2017-11-21 (×3): qty 2
  Filled 2017-11-21: qty 1
  Filled 2017-11-21 (×3): qty 2
  Filled 2017-11-21: qty 1

## 2017-11-21 MED ORDER — ENOXAPARIN SODIUM 40 MG/0.4ML ~~LOC~~ SOLN
40.0000 mg | SUBCUTANEOUS | Status: DC
  Administered 2017-11-21 – 2017-11-24 (×4): 40 mg via SUBCUTANEOUS
  Filled 2017-11-21 (×4): qty 0.4

## 2017-11-21 NOTE — Progress Notes (Signed)
4 Days Post-Op  Subjective: No new complaint, reports she ate OK, happy to get foley out  Objective: Vital signs in last 24 hours: Temp:  [97.7 F (36.5 C)-100 F (37.8 C)] 99 F (37.2 C) (02/04 0700) Pulse Rate:  [77-119] 79 (02/04 0700) Resp:  [15-28] 20 (02/04 0700) BP: (107-134)/(66-99) 114/79 (02/04 0700) SpO2:  [95 %-100 %] 96 % (02/04 0700) Weight:  [57.3 kg (126 lb 5.2 oz)] 57.3 kg (126 lb 5.2 oz) (02/04 0500) Last BM Date: 11/20/17  Intake/Output from previous day: 02/03 0701 - 02/04 0700 In: 1880 [P.O.:480; I.V.:1200; IV Piggyback:200] Out: 2010 [Urine:2010] Intake/Output this shift: No intake/output data recorded.  General appearance: alert and cooperative Resp: clear to auscultation bilaterally Cardio: regular rate and rhythm GI: soft, ND, NT Extremities: ortho dressing RLE, toes warm, doppler charging  Neuro: alert, F/C  Lab Results: CBC  Recent Labs    11/19/17 0454  WBC 8.7  HGB 9.0*  HCT 26.5*  PLT 224   BMET Recent Labs    11/18/17 1702 11/19/17 0454  NA 139 139  K 3.0* 3.3*  CL 100* 103  CO2 27 25  GLUCOSE 121* 127*  BUN 7 9  CREATININE 0.51 0.59  CALCIUM 7.9* 8.0*   PT/INR No results for input(s): LABPROT, INR in the last 72 hours. ABG Recent Labs    11/18/17 0951  PHART 7.392  HCO3 28.1*    Studies/Results: No results found.  Anti-infectives: Anti-infectives (From admission, onward)   Start     Dose/Rate Route Frequency Ordered Stop   11/15/17 1800  piperacillin-tazobactam (ZOSYN) IVPB 3.375 g     3.375 g 12.5 mL/hr over 240 Minutes Intravenous Every 8 hours 11/15/17 1110     11/15/17 1115  piperacillin-tazobactam (ZOSYN) IVPB 3.375 g     3.375 g 100 mL/hr over 30 Minutes Intravenous  Once 11/15/17 1110 11/15/17 1310      Assessment/Plan: PHBC Scalp lac - closed in ED, plan staple removal 2/6 T12 FX - no brace per Dr. Cyndy Freeze Concussion L retroperitoneal hematoma  R acetab and inf ramus FX - non-op per Dr.  Marcelino Scot R tib fib FX with plateau FX - S/P ORIF by Dr. Marcelino Scot 1/31 Acute hypoxic resp failure - much improved ABL anemia - last Hb 9, CBC in AM ID - D/C Zosyn FEN - D1 nectar thick, ST following VTE - Lovenox Dispo - to floor, CIR consult  LOS: 9 days    Georganna Skeans, MD, MPH, FACS Trauma: [phone] General Surgery: [phone]  2/4/2019Patient ID: Alphonsa Overall, female   DOB: Feb 16, 1986, 32 y.o.   MRN: [id number]
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
## 2017-11-22 LAB — CBC
HEMATOCRIT: 32.4 % — AB (ref 36.0–46.0)
Hemoglobin: 10.7 g/dL — ABNORMAL LOW (ref 12.0–15.0)
MCH: 28.2 pg (ref 26.0–34.0)
MCHC: 33 g/dL (ref 30.0–36.0)
MCV: 85.5 fL (ref 78.0–100.0)
PLATELETS: 517 10*3/uL — AB (ref 150–400)
RBC: 3.79 MIL/uL — ABNORMAL LOW (ref 3.87–5.11)
RDW: 14.9 % (ref 11.5–15.5)
WBC: 9.4 10*3/uL (ref 4.0–10.5)

## 2017-11-22 MED ORDER — MORPHINE SULFATE (PF) 4 MG/ML IV SOLN
1.0000 mg | INTRAVENOUS | Status: DC | PRN
  Filled 2017-11-22: qty 1
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
## 2017-11-22 NOTE — Progress Notes (Signed)
  [facility] Progress Note  5 Days Post-Op  Subjective: CC-  No complaints. States that she currently has no pain, took oxycodone only 3 times yesterday. Tolerating diet. Denies n/v. Had a BM this morning. Anxious to continue therapy and get out of the hospital.  Lives at home with boyfriend and her 2 children.  Objective: Vital signs in last 24 hours: Temp:  [98.1 F (36.7 C)-99.9 F (37.7 C)] 99.9 F (37.7 C) (02/05 9326) Pulse Rate:  [79-101] 79 (02/05 0633) Resp:  [13-18] 16 (02/05 0633) BP: (123-147)/(82-107) 129/89 (02/05 0633) SpO2:  [96 %-99 %] 99 % (02/05 0633) Last BM Date: 11/21/17  Intake/Output from previous day: 02/04 0701 - 02/05 0700 In: 108.7 [I.V.:108.7] Out: 287 [Urine:286; Stool:1] Intake/Output this shift: No intake/output data recorded.  PE: Gen:  Alert, NAD HEENT: EOM's intact, pupils equal and round. Posterior scalp lac with staples intact and no erythema or drainage Card:  RRR, no M/G/R heard Pulm:  CTAB, no W/R/R, effort normal Abd: Soft, NT/ND, +BS, no HSM, no hernia Ext:  Short leg splint to RLE, toes WWP, able to wiggle toes Psych: A&Ox3 Skin: no rashes noted, warm and dry  Lab Results:  Recent Labs    11/22/17 0624  WBC 9.4  HGB 10.7*  HCT 32.4*  PLT 517*   BMET No results for input(s): NA, K, CL, CO2, GLUCOSE, BUN, CREATININE, CALCIUM in the last 72 hours. PT/INR No results for input(s): LABPROT, INR in the last 72 hours. CMP     Component Value Date/Time   NA 139 11/19/2017 0454   K 3.3 (L) 11/19/2017 0454   CL 103 11/19/2017 0454   CO2 25 11/19/2017 0454   GLUCOSE 127 (H) 11/19/2017 0454   BUN 9 11/19/2017 0454   CREATININE 0.59 11/19/2017 0454   CALCIUM 8.0 (L) 11/19/2017 0454   PROT 5.1 (L) 11/12/2017 2105   ALBUMIN 3.0 (L) 11/12/2017 2105   AST 130 (H) 11/12/2017 2105   ALT 68 (H) 11/12/2017 2105   ALKPHOS 52 11/12/2017 2105   BILITOT 0.5 11/12/2017 2105   GFRNONAA >60 11/19/2017 0454   GFRAA >60  11/19/2017 0454   Lipase  No results found for: LIPASE     Studies/Results: No results found.  Anti-infectives: Anti-infectives (From admission, onward)   Start     Dose/Rate Route Frequency Ordered Stop   11/15/17 1800  piperacillin-tazobactam (ZOSYN) IVPB 3.375 g  Status:  Discontinued     3.375 g 12.5 mL/hr over 240 Minutes Intravenous Every 8 hours 11/15/17 1110 11/21/17 0808   11/15/17 1115  piperacillin-tazobactam (ZOSYN) IVPB 3.375 g     3.375 g 100 mL/hr over 30 Minutes Intravenous  Once 11/15/17 1110 11/15/17 1310       Assessment/Plan PHBC Scalp lac- closed in ED, plan staple removal 2/6 T12 FX - no brace per Dr. Cyndy Freeze Concussion L retroperitoneal hematoma - Hg 10.7, stable R acetab and inf ramus FX- non-op per Dr. Marcelino Scot R tib fib FX with plateau FX- S/P ORIF by Dr. Marcelino Scot 1/31. NWB RLE in splint, unrestricted ROM R hip and knee Acute hypoxic resp failure- much improved ABL anemia- last Hb 9, CBC in AM ID - Zosyn 1/29>>2/4. WBC 9.4, TMAX 99.9 FEN- regular diet VTE- Lovenox Dispo-Continue therapies. CIR following.    LOS: 10 days    Wellington Hampshire , [facility] 11/22/2017, 7:58 AM Pager: [phone] Consults: [phone] Mon-Fri 7:00 am-4:30 pm Sat-Sun 7:00 am-11:30 am

## 2017-11-22 NOTE — Progress Notes (Signed)
Rehab admissions - I met with patient and her sister at the bedside.  Patient would like to admit to acute inpatient rehab.  Her sister plans to help at discharge while her boyfriend, Merrily Pew, is a work.  59 mom is helping with the daughters who are 71 and 30 years old.  I will try to get a rehab bed over the next day or so.  Call me for questions.  #[phone]

## 2017-11-23 ENCOUNTER — Other Ambulatory Visit: Payer: Self-pay

## 2017-11-23 ENCOUNTER — Inpatient Hospital Stay (HOSPITAL_COMMUNITY): Payer: Medicaid Other

## 2017-11-23 ENCOUNTER — Encounter (HOSPITAL_COMMUNITY): Payer: Self-pay | Admitting: General Practice

## 2017-11-23 LAB — CBC
HCT: 36 % (ref 36.0–46.0)
Hemoglobin: 11.9 g/dL — ABNORMAL LOW (ref 12.0–15.0)
MCH: 29 pg (ref 26.0–34.0)
MCHC: 33.1 g/dL (ref 30.0–36.0)
MCV: 87.8 fL (ref 78.0–100.0)
PLATELETS: 570 10*3/uL — AB (ref 150–400)
RBC: 4.1 MIL/uL (ref 3.87–5.11)
RDW: 16.1 % — AB (ref 11.5–15.5)
WBC: 11.9 10*3/uL — ABNORMAL HIGH (ref 4.0–10.5)

## 2017-11-23 LAB — URINALYSIS, ROUTINE W REFLEX MICROSCOPIC
Bilirubin Urine: NEGATIVE
Glucose, UA: NEGATIVE mg/dL
KETONES UR: NEGATIVE mg/dL
Leukocytes, UA: NEGATIVE
Nitrite: NEGATIVE
PH: 7 (ref 5.0–8.0)
Protein, ur: NEGATIVE mg/dL
SPECIFIC GRAVITY, URINE: 1.01 (ref 1.005–1.030)

## 2017-11-23 MED ORDER — ENSURE ENLIVE PO LIQD
237.0000 mL | Freq: Two times a day (BID) | ORAL | Status: DC
  Administered 2017-11-23 – 2017-11-24 (×2): 237 mL via ORAL

## 2017-11-23 NOTE — Progress Notes (Signed)
Rehab admissions - patient continues to be appropriate for acute inpatient rehab admission.  However, no bed available today for patient.  I will check back tomorrow for bed availability.  Call me for questions.  #[phone]
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
## 2017-11-24 ENCOUNTER — Encounter (HOSPITAL_COMMUNITY): Payer: Self-pay

## 2017-11-24 ENCOUNTER — Inpatient Hospital Stay (HOSPITAL_COMMUNITY)
Admission: RE | Admit: 2017-11-24 | Discharge: 2017-11-30 | DRG: 559 | Disposition: A | Discharge status: Home Health | Payer: Medicaid Other | Source: Intra-hospital | Attending: Physical Medicine & Rehabilitation | Admitting: Physical Medicine & Rehabilitation

## 2017-11-24 ENCOUNTER — Other Ambulatory Visit: Payer: Self-pay

## 2017-11-24 DIAGNOSIS — D473 Essential (hemorrhagic) thrombocythemia: Secondary | ICD-10-CM

## 2017-11-24 DIAGNOSIS — D62 Acute posthemorrhagic anemia: Secondary | ICD-10-CM | POA: Diagnosis present

## 2017-11-24 DIAGNOSIS — S82401D Unspecified fracture of shaft of right fibula, subsequent encounter for closed fracture with routine healing: Secondary | ICD-10-CM

## 2017-11-24 DIAGNOSIS — S22080A Wedge compression fracture of T11-T12 vertebra, initial encounter for closed fracture: Secondary | ICD-10-CM

## 2017-11-24 DIAGNOSIS — E876 Hypokalemia: Secondary | ICD-10-CM | POA: Diagnosis present

## 2017-11-24 DIAGNOSIS — S069X9A Unspecified intracranial injury with loss of consciousness of unspecified duration, initial encounter: Secondary | ICD-10-CM | POA: Diagnosis present

## 2017-11-24 DIAGNOSIS — F17210 Nicotine dependence, cigarettes, uncomplicated: Secondary | ICD-10-CM | POA: Diagnosis present

## 2017-11-24 DIAGNOSIS — K5900 Constipation, unspecified: Secondary | ICD-10-CM | POA: Diagnosis present

## 2017-11-24 DIAGNOSIS — K219 Gastro-esophageal reflux disease without esophagitis: Secondary | ICD-10-CM | POA: Diagnosis present

## 2017-11-24 DIAGNOSIS — K661 Hemoperitoneum: Secondary | ICD-10-CM | POA: Diagnosis present

## 2017-11-24 DIAGNOSIS — F419 Anxiety disorder, unspecified: Secondary | ICD-10-CM | POA: Diagnosis present

## 2017-11-24 DIAGNOSIS — L03115 Cellulitis of right lower limb: Secondary | ICD-10-CM

## 2017-11-24 DIAGNOSIS — D72829 Elevated white blood cell count, unspecified: Secondary | ICD-10-CM

## 2017-11-24 DIAGNOSIS — S82141S Displaced bicondylar fracture of right tibia, sequela: Secondary | ICD-10-CM | POA: Diagnosis not present

## 2017-11-24 DIAGNOSIS — S8411XD Injury of peroneal nerve at lower leg level, right leg, subsequent encounter: Secondary | ICD-10-CM | POA: Diagnosis not present

## 2017-11-24 DIAGNOSIS — G8918 Other acute postprocedural pain: Secondary | ICD-10-CM | POA: Diagnosis present

## 2017-11-24 DIAGNOSIS — S32591D Other specified fracture of right pubis, subsequent encounter for fracture with routine healing: Secondary | ICD-10-CM

## 2017-11-24 DIAGNOSIS — S80821D Blister (nonthermal), right lower leg, subsequent encounter: Secondary | ICD-10-CM

## 2017-11-24 DIAGNOSIS — S22089S Unspecified fracture of T11-T12 vertebra, sequela: Secondary | ICD-10-CM | POA: Diagnosis not present

## 2017-11-24 DIAGNOSIS — D75839 Thrombocytosis, unspecified: Secondary | ICD-10-CM

## 2017-11-24 DIAGNOSIS — S82401A Unspecified fracture of shaft of right fibula, initial encounter for closed fracture: Secondary | ICD-10-CM

## 2017-11-24 DIAGNOSIS — S82201S Unspecified fracture of shaft of right tibia, sequela: Secondary | ICD-10-CM | POA: Diagnosis not present

## 2017-11-24 DIAGNOSIS — S22089D Unspecified fracture of T11-T12 vertebra, subsequent encounter for fracture with routine healing: Secondary | ICD-10-CM | POA: Diagnosis not present

## 2017-11-24 DIAGNOSIS — S32401D Unspecified fracture of right acetabulum, subsequent encounter for fracture with routine healing: Secondary | ICD-10-CM | POA: Diagnosis present

## 2017-11-24 DIAGNOSIS — S069X3D Unspecified intracranial injury with loss of consciousness of 1 hour to 5 hours 59 minutes, subsequent encounter: Secondary | ICD-10-CM | POA: Diagnosis not present

## 2017-11-24 DIAGNOSIS — F1010 Alcohol abuse, uncomplicated: Secondary | ICD-10-CM | POA: Diagnosis present

## 2017-11-24 DIAGNOSIS — S0101XD Laceration without foreign body of scalp, subsequent encounter: Secondary | ICD-10-CM

## 2017-11-24 DIAGNOSIS — R7989 Other specified abnormal findings of blood chemistry: Secondary | ICD-10-CM | POA: Diagnosis present

## 2017-11-24 DIAGNOSIS — S82141D Displaced bicondylar fracture of right tibia, subsequent encounter for closed fracture with routine healing: Secondary | ICD-10-CM | POA: Diagnosis not present

## 2017-11-24 DIAGNOSIS — S060X9D Concussion with loss of consciousness of unspecified duration, subsequent encounter: Secondary | ICD-10-CM

## 2017-11-24 DIAGNOSIS — S82201A Unspecified fracture of shaft of right tibia, initial encounter for closed fracture: Secondary | ICD-10-CM | POA: Diagnosis present

## 2017-11-24 DIAGNOSIS — S22089A Unspecified fracture of T11-T12 vertebra, initial encounter for closed fracture: Secondary | ICD-10-CM | POA: Diagnosis present

## 2017-11-24 DIAGNOSIS — S069X9S Unspecified intracranial injury with loss of consciousness of unspecified duration, sequela: Secondary | ICD-10-CM | POA: Diagnosis not present

## 2017-11-24 DIAGNOSIS — J690 Pneumonitis due to inhalation of food and vomit: Secondary | ICD-10-CM | POA: Diagnosis present

## 2017-11-24 DIAGNOSIS — M21371 Foot drop, right foot: Secondary | ICD-10-CM | POA: Diagnosis present

## 2017-11-24 DIAGNOSIS — S82141A Displaced bicondylar fracture of right tibia, initial encounter for closed fracture: Secondary | ICD-10-CM | POA: Diagnosis present

## 2017-11-24 DIAGNOSIS — S329XXA Fracture of unspecified parts of lumbosacral spine and pelvis, initial encounter for closed fracture: Secondary | ICD-10-CM | POA: Diagnosis present

## 2017-11-24 DIAGNOSIS — S8411XA Injury of peroneal nerve at lower leg level, right leg, initial encounter: Secondary | ICD-10-CM | POA: Diagnosis present

## 2017-11-24 DIAGNOSIS — S069XAA Unspecified intracranial injury with loss of consciousness status unknown, initial encounter: Secondary | ICD-10-CM | POA: Diagnosis present

## 2017-11-24 DIAGNOSIS — T1490XA Injury, unspecified, initial encounter: Secondary | ICD-10-CM | POA: Diagnosis present

## 2017-11-24 LAB — CBC
HCT: 34.8 % — ABNORMAL LOW (ref 36.0–46.0)
Hemoglobin: 11.2 g/dL — ABNORMAL LOW (ref 12.0–15.0)
MCH: 28.4 pg (ref 26.0–34.0)
MCHC: 32.2 g/dL (ref 30.0–36.0)
MCV: 88.1 fL (ref 78.0–100.0)
Platelets: 539 10*3/uL — ABNORMAL HIGH (ref 150–400)
RBC: 3.95 MIL/uL (ref 3.87–5.11)
RDW: 16.2 % — ABNORMAL HIGH (ref 11.5–15.5)
WBC: 11.8 10*3/uL — ABNORMAL HIGH (ref 4.0–10.5)

## 2017-11-24 MED ORDER — OXYCODONE HCL 5 MG PO TABS
5.0000 mg | ORAL_TABLET | ORAL | Status: DC | PRN
Start: 2017-11-24 — End: 2017-11-29
  Administered 2017-11-24: 5 mg via ORAL
  Administered 2017-11-25 (×2): 10 mg via ORAL
  Administered 2017-11-25: 5 mg via ORAL
  Administered 2017-11-26: 10 mg via ORAL
  Administered 2017-11-26: 5 mg via ORAL
  Administered 2017-11-26: 10 mg via ORAL
  Administered 2017-11-27: 5 mg via ORAL
  Administered 2017-11-27 – 2017-11-28 (×2): 10 mg via ORAL
  Administered 2017-11-28: 5 mg via ORAL
  Filled 2017-11-24 (×3): qty 1
  Filled 2017-11-24 (×2): qty 2
  Filled 2017-11-24: qty 1
  Filled 2017-11-24 (×2): qty 2
  Filled 2017-11-24: qty 1
  Filled 2017-11-24: qty 2
  Filled 2017-11-24 (×2): qty 1

## 2017-11-24 MED ORDER — GUAIFENESIN-DM 100-10 MG/5ML PO SYRP: 5.0000 mL | ORAL_SOLUTION | Freq: Four times a day (QID) | ORAL | Status: DC | PRN

## 2017-11-24 MED ORDER — TRAZODONE HCL 50 MG PO TABS: 25.0000 mg | ORAL_TABLET | Freq: Every evening | ORAL | Status: DC | PRN

## 2017-11-24 MED ORDER — ADULT MULTIVITAMIN W/MINERALS CH
1.0000 | ORAL_TABLET | Freq: Every day | ORAL | Status: DC
  Administered 2017-11-25 – 2017-11-30 (×6): 1 via ORAL
  Filled 2017-11-24 (×6): qty 1

## 2017-11-24 MED ORDER — VITAMIN B-1 100 MG PO TABS
100.0000 mg | ORAL_TABLET | Freq: Every day | ORAL | Status: DC
  Administered 2017-11-25 – 2017-11-30 (×6): 100 mg via ORAL
  Filled 2017-11-24 (×6): qty 1

## 2017-11-24 MED ORDER — FOLIC ACID 1 MG PO TABS
1.0000 mg | ORAL_TABLET | Freq: Every day | ORAL | Status: DC
  Administered 2017-11-25 – 2017-11-30 (×6): 1 mg via ORAL
  Filled 2017-11-24 (×6): qty 1

## 2017-11-24 MED ORDER — ALUM & MAG HYDROXIDE-SIMETH 200-200-20 MG/5ML PO SUSP: 30.0000 mL | ORAL | Status: DC | PRN

## 2017-11-24 MED ORDER — PROCHLORPERAZINE 25 MG RE SUPP: 12.5000 mg | Freq: Four times a day (QID) | RECTAL | Status: DC | PRN

## 2017-11-24 MED ORDER — FLEET ENEMA 7-19 GM/118ML RE ENEM: 1.0000 | ENEMA | Freq: Once | RECTAL | Status: DC | PRN

## 2017-11-24 MED ORDER — POLYETHYLENE GLYCOL 3350 17 G PO PACK: 17.0000 g | PACK | Freq: Every day | ORAL | Status: DC | PRN

## 2017-11-24 MED ORDER — ACETAMINOPHEN 325 MG PO TABS
325.0000 mg | ORAL_TABLET | ORAL | Status: DC | PRN
  Administered 2017-11-26 – 2017-11-28 (×3): 650 mg via ORAL
  Filled 2017-11-24 (×4): qty 2

## 2017-11-24 MED ORDER — PANTOPRAZOLE SODIUM 40 MG PO TBEC
40.0000 mg | DELAYED_RELEASE_TABLET | Freq: Every day | ORAL | Status: DC
  Administered 2017-11-25 – 2017-11-30 (×6): 40 mg via ORAL
  Filled 2017-11-24 (×6): qty 1

## 2017-11-24 MED ORDER — ENSURE ENLIVE PO LIQD: 237.0000 mL | Freq: Two times a day (BID) | ORAL | Status: DC

## 2017-11-24 MED ORDER — ENOXAPARIN SODIUM 40 MG/0.4ML ~~LOC~~ SOLN
40.0000 mg | SUBCUTANEOUS | Status: DC
  Administered 2017-11-25 – 2017-11-30 (×6): 40 mg via SUBCUTANEOUS
  Filled 2017-11-24 (×6): qty 0.4

## 2017-11-24 MED ORDER — PROCHLORPERAZINE EDISYLATE 5 MG/ML IJ SOLN: 5.0000 mg | Freq: Four times a day (QID) | INTRAMUSCULAR | Status: DC | PRN

## 2017-11-24 MED ORDER — ENSURE ENLIVE PO LIQD
237.0000 mL | Freq: Two times a day (BID) | ORAL | Status: DC
  Administered 2017-11-25 – 2017-11-29 (×10): 237 mL via ORAL
  Filled 2017-11-24 (×2): qty 237

## 2017-11-24 MED ORDER — DIPHENHYDRAMINE HCL 12.5 MG/5ML PO ELIX: 12.5000 mg | ORAL_SOLUTION | Freq: Four times a day (QID) | ORAL | Status: DC | PRN

## 2017-11-24 MED ORDER — PROCHLORPERAZINE MALEATE 5 MG PO TABS: 5.0000 mg | ORAL_TABLET | Freq: Four times a day (QID) | ORAL | Status: DC | PRN

## 2017-11-24 MED ORDER — TRAMADOL HCL 50 MG PO TABS
50.0000 mg | ORAL_TABLET | Freq: Four times a day (QID) | ORAL | Status: DC | PRN
  Filled 2017-11-24: qty 1

## 2017-11-24 MED ORDER — BISACODYL 10 MG RE SUPP: 10.0000 mg | Freq: Every day | RECTAL | Status: DC | PRN

## 2017-11-24 NOTE — Progress Notes (Signed)
Rehab admissions - Bed available on inpatient rehab for patient today.  Will admit today.  Call me for questions.  #[phone]

## 2017-11-24 NOTE — Consult Note (Addendum)
[facility] Nurse wound consult note Reason for Consult: right heel wound Wound type: deep tissue pressure injury Pressure Injury POA: No Measurement:2cm x 2cm x 0cm Wound bed: dark purple, with blood filled center bulla Drainage (amount, consistency, odor) none Periwound: edema, palpable pulses  Dressing procedure/placement/frequency: Silicone foam dressing to protect from further injury Add Prevalon boot to offload Image taken of affected areas on the patient's right heel verbal consent given from patient to use for educational purposes.  Image shown to patient and no identifiers in the image verified by patient.   [place] Nurse team will follow along with you for weekly wound assessments.  Please notify me of any acute changes in the wounds or any new areas of concerns Duncan MSN, RN,CWOCN, [place], [place]
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
## 2017-11-24 NOTE — Progress Notes (Signed)
Patient was transported via bed to 4W08, reported off to RN
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
## 2017-11-25 ENCOUNTER — Inpatient Hospital Stay (HOSPITAL_COMMUNITY): Payer: Medicaid Other | Admitting: Speech Pathology

## 2017-11-25 ENCOUNTER — Inpatient Hospital Stay (HOSPITAL_COMMUNITY): Payer: Self-pay | Admitting: Physical Therapy

## 2017-11-25 ENCOUNTER — Inpatient Hospital Stay (HOSPITAL_COMMUNITY): Payer: Medicaid Other | Admitting: Occupational Therapy

## 2017-11-25 DIAGNOSIS — S82401S Unspecified fracture of shaft of right fibula, sequela: Secondary | ICD-10-CM

## 2017-11-25 DIAGNOSIS — S82201S Unspecified fracture of shaft of right tibia, sequela: Secondary | ICD-10-CM

## 2017-11-25 DIAGNOSIS — S8411XS Injury of peroneal nerve at lower leg level, right leg, sequela: Secondary | ICD-10-CM

## 2017-11-25 DIAGNOSIS — S22089S Unspecified fracture of T11-T12 vertebra, sequela: Secondary | ICD-10-CM

## 2017-11-25 LAB — COMPREHENSIVE METABOLIC PANEL
ALBUMIN: 3.2 g/dL — AB (ref 3.5–5.0)
ALK PHOS: 126 U/L (ref 38–126)
ALT: 33 U/L (ref 14–54)
AST: 26 U/L (ref 15–41)
Anion gap: 12 (ref 5–15)
BUN: 12 mg/dL (ref 6–20)
CALCIUM: 9.3 mg/dL (ref 8.9–10.3)
CO2: 24 mmol/L (ref 22–32)
CREATININE: 0.75 mg/dL (ref 0.44–1.00)
Chloride: 102 mmol/L (ref 101–111)
GFR calc non Af Amer: >60 mL/min (ref >60)
GLUCOSE: 109 mg/dL — AB (ref 65–99)
Potassium: 4.4 mmol/L (ref 3.5–5.1)
SODIUM: 138 mmol/L (ref 135–145)
Total Bilirubin: 1 mg/dL (ref 0.3–1.2)
Total Protein: 7.1 g/dL (ref 6.5–8.1)

## 2017-11-25 LAB — CBC WITH DIFFERENTIAL/PLATELET
Basophils Absolute: 0.2 10*3/uL — ABNORMAL HIGH (ref 0.0–0.1)
Basophils Relative: 1 %
EOS ABS: 0.4 10*3/uL (ref 0.0–0.7)
Eosinophils Relative: 3 %
HCT: 37.8 % (ref 36.0–46.0)
HEMOGLOBIN: 12.2 g/dL (ref 12.0–15.0)
Lymphocytes Relative: 13 %
Lymphs Abs: 1.8 10*3/uL (ref 0.7–4.0)
MCH: 29 pg (ref 26.0–34.0)
MCHC: 32.3 g/dL (ref 30.0–36.0)
MCV: 89.8 fL (ref 78.0–100.0)
Monocytes Absolute: 1.4 10*3/uL — ABNORMAL HIGH (ref 0.1–1.0)
Monocytes Relative: 11 %
NEUTROS PCT: 72 %
Neutro Abs: 9.8 10*3/uL — ABNORMAL HIGH (ref 1.7–7.7)
Platelets: 643 10*3/uL — ABNORMAL HIGH (ref 150–400)
RBC: 4.21 MIL/uL (ref 3.87–5.11)
RDW: 16.6 % — ABNORMAL HIGH (ref 11.5–15.5)
WBC: 13.6 10*3/uL — AB (ref 4.0–10.5)

## 2017-11-25 MED ORDER — CEPHALEXIN 250 MG PO CAPS
250.0000 mg | ORAL_CAPSULE | Freq: Four times a day (QID) | ORAL | Status: DC
  Administered 2017-11-25 – 2017-11-30 (×21): 250 mg via ORAL
  Filled 2017-11-25 (×21): qty 1
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
## 2017-11-25 NOTE — Plan of Care (Signed)
LTGs established 11/25/17
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
## 2017-11-25 NOTE — IPOC Note (Signed)
Overall Plan of Care [facility]) Patient Details Name: Kristine Mosley MRN: [id number] DOB: [birth year]  Admitting Diagnosis: Closed fracture of right tibial plateau  Hospital Problems: Principal Problem:   Closed fracture of right tibial plateau Active Problems:   Pelvic fracture ([place])   Closed fracture of shaft of right tibia and fibula   ETOH abuse   Mild TBI ([facility])   Closed T12 fracture ([facility])   Right peroneal nerve injury   Trauma   Leukocytosis   Cellulitis of right lower extremity   Postoperative pain     Functional Problem List: Nursing Bowel, Pain, Skin Integrity, Safety  PT Balance, Endurance, Motor, Pain, Safety  OT Balance, Pain, Safety, Endurance, Skin Integrity, Motor  SLP    TR         Basic ADL's: OT Grooming, Bathing, Dressing, Toileting     Advanced  ADL's: OT Simple Meal Preparation     Transfers: PT Bed Mobility, Bed to Chair, Furniture, Musician, Floor  OT Toilet, Metallurgist: PT Ambulation, Emergency planning/management officer, Stairs     Additional Impairments: OT None  SLP        TR      Anticipated Outcomes Item Anticipated Outcome  Self Feeding No goal  Swallowing      Basic self-care  Supervision/setup-Mod I   Toileting  Mod I    Bathroom Transfers Supervision/setup-Mod I   Bowel/Bladder  remain continent, empty bowel regularly mod I  Transfers  Mod I  Locomotion  Mod I with LRAD  Communication     Cognition     Pain  less than 3  Safety/Judgment  Remain free of falls, breakdown and infection    Therapy Plan: PT Intensity: Minimum of 1-2 x/day ,45 to 90 minutes PT Frequency: 5 out of 7 days PT Duration Estimated Length of Stay: 7-10 days OT Intensity: Minimum of 1-2 x/day, 45 to 90 minutes OT Frequency: 5 out of 7 days OT Duration/Estimated Length of Stay: 7-10 days      Team Interventions: Nursing Interventions Patient/Family Education, Bowel Management, Pain Management, Skin Care/Wound Management  PT  interventions Ambulation/gait training, Training and development officer, Community reintegration, Discharge planning, DME/adaptive equipment instruction, Functional mobility training, Pain management, Patient/family education, Stair training, Therapeutic Activities, Therapeutic Exercise, UE/LE Strength taining/ROM, UE/LE Coordination activities, Wheelchair propulsion/positioning  OT Interventions Training and development officer, Discharge planning, Pain management, Self Care/advanced ADL retraining, Functional electrical stimulation, Therapeutic Activities, UE/LE Coordination activities, Cognitive remediation/compensation, Disease mangement/prevention, Functional mobility training, Patient/family education, Skin care/wound managment, Therapeutic Exercise, Visual/perceptual remediation/compensation, Academic librarian, Engineer, drilling, Neuromuscular re-education, Psychosocial support, UE/LE Strength taining/ROM, Splinting/orthotics, Wheelchair propulsion/positioning  SLP Interventions    TR Interventions    SW/CM Interventions Discharge Planning, Psychosocial Support, Patient/Family Education   Barriers to Discharge MD  Medical stability and Weight bearing restrictions  Nursing      PT Decreased caregiver support, Medical stability, Home environment access/layout, Weight bearing restrictions    OT Wound Care, Weight bearing restrictions    SLP      SW       Team Discharge Planning: Destination: PT-Home ,OT- Home , SLP-  Projected Follow-up: PT-Home health PT, Outpatient PT(TBD pending progress), OT-  24 hour supervision/assistance, SLP-None Projected Equipment Needs: PT-To be determined, OT- Tub/shower bench, 3 in 1 bedside comode, SLP-None recommended by SLP Equipment Details: PT-RW vs wheelchair, OT-  Patient/family involved in discharge planning: PT- Patient,  OT-Patient, SLP-Patient  MD ELOS: 7-10 days Medical Rehab Prognosis:  Excellent Assessment: The patient has been  admitted for CIR therapies with the diagnosis of polytrauma. The team will be addressing functional mobility, strength, stamina, balance, safety, adaptive techniques and equipment, self-care, bowel and bladder mgt, patient and caregiver education, pain mgt, wb precautions and surgical precautions, wound care, ego support. Goals have been set at mod I for basic mobility and self-care .    Meredith Staggers, MD, FAAPMR      See Team Conference Notes for weekly updates to the plan of care

## 2017-11-26 ENCOUNTER — Inpatient Hospital Stay (HOSPITAL_COMMUNITY): Payer: Medicaid Other | Admitting: Speech Pathology

## 2017-11-26 ENCOUNTER — Inpatient Hospital Stay (HOSPITAL_COMMUNITY): Payer: Medicaid Other | Admitting: Physical Therapy

## 2017-11-26 ENCOUNTER — Inpatient Hospital Stay (HOSPITAL_COMMUNITY): Payer: Medicaid Other | Admitting: Occupational Therapy

## 2017-11-26 DIAGNOSIS — S32424S Nondisplaced fracture of posterior wall of right acetabulum, sequela: Secondary | ICD-10-CM

## 2017-11-26 DIAGNOSIS — S069X9S Unspecified intracranial injury with loss of consciousness of unspecified duration, sequela: Secondary | ICD-10-CM

## 2017-11-26 DIAGNOSIS — D72829 Elevated white blood cell count, unspecified: Secondary | ICD-10-CM

## 2017-11-26 DIAGNOSIS — G8918 Other acute postprocedural pain: Secondary | ICD-10-CM

## 2017-11-26 DIAGNOSIS — T1490XA Injury, unspecified, initial encounter: Secondary | ICD-10-CM

## 2017-11-26 DIAGNOSIS — S82141S Displaced bicondylar fracture of right tibia, sequela: Secondary | ICD-10-CM

## 2017-11-26 DIAGNOSIS — L03115 Cellulitis of right lower limb: Secondary | ICD-10-CM

## 2017-11-26 NOTE — Progress Notes (Signed)
Physical Therapy Session Note  Patient Details  Name: Kristine Mosley MRN: [id number] Date of Birth: 23-Aug-1986  Today's Date: 11/26/2017 PT Individual Time: 3536-1443 PT Individual Time Calculation (min): 58 min   Short Term Goals: Week 1:  PT Short Term Goal 1 (Week 1): =LTG due to ELOS  Skilled Therapeutic Interventions/Progress Updates:  Pt received in bed & agreeable to tx. Pt's fiance ([name]) present for session. Pt donned L sock & shoe with set up assist and transferred to EOB with supervision with use of hospital bed features. Pt completes bed>w/c via squat pivot with supervision with pt able to recall need to lock brakes. Pt propelled w/c room<>gym with BUE & supervision. Discussed DME recommendations upon d/c with therapist recommending 16x18 w/c and RW; also discussed f/u therapies. Gait training x 35 ft + 35 ft with RW & steady assist fade to supervision overall with pt able to maintain NWB RLE. Pt requires seated rest breaks 2/2 overall fatigue & chest ache that decreases with rest. Pt reports she has ~5 steps to enter front door but only single step to enter back door and she plans to use this entrance. Therapist provided instructional education and demonstration for negotiating 6" step with RW. Pt return demonstrated activity with min assist and again with assistance of [name] - pt lightly bumped R toes on step and therapist provided cuing for improved technique (pt denied c/o pain once sitting). Therapist educated [name] on positioning and technique when assisting pt. Pt utilized BUE ergometer on level 1 x 5 minutes forwards for BUE strengthening and cardiopulmonary endurance training. At end of session pt returned to room & returned to bed via stand pivot with assistance from [name]. [name] checked off to assist pt with bed<>w/c transfers & RN made aware. Pt left in bed with alarm set, all needs within reach, & fiance present to supervise.  Pt slightly impulsive and reports wanting to be  independent - therapist educated her on need for increased safety with all mobility.   Therapy Documentation Precautions:  Precautions Precautions: Fall Precaution Comments: TBI Restrictions Weight Bearing Restrictions: Yes RLE Weight Bearing: Non weight bearing Other Position/Activity Restrictions: no ankle, knee or hip ROM restrictions, no need for back brace  Pain: Reports intermittent L rib pain - RN made aware. Pt bumped R Knee on RW & reported pt that dissipated with time. - RN made aware Pt with c/o chest aching with gait that reduced with rest.   See Function Navigator for Current Functional Status.   Therapy/Group: Individual Therapy  [name] 11/26/2017, 10:36 AM

## 2017-11-26 NOTE — Progress Notes (Signed)
Occupational Therapy Session Note  Patient Details  Name: Kristine Mosley MRN: [id number] Date of Birth: 31-Dec-1985  Today's Date: 11/26/2017 OT Individual Time: 0109-3235 and 5732-2025 OT Individual Time Calculation (min): 60 min and 45 min   Short Term Goals: Week 1:  OT Short Term Goal 1 (Week 1): STGs=LTGs due to ELOS  Skilled Therapeutic Interventions/Progress Updates:    Pt greeted supine in bed. Receiving pain medication from RN. Pt reports pain in back/R LE, however agreeable to shower. Fiance present for family education today. Stand pivot<w/c<TTB with Min A and cues for technique and w/c safety. After consultation with RN, R LE bandages removed and wounds/incisions washed with warm water. Pt showering while seated on TTB, lateral leans for pericare and pt able to reach Lt foot safely to wash. RN arrived after bathing to reapply R LE bandages. Stand pivot<w/c completed with steady assist and pt proceeded with dressing w/c level at sink. Min vcs for completing ADL at max level of independence instead of requesting assist from fiance. She completed LB dressing with steady assist and min vcs for keeping Rt toes off of floor. Pt at this point very fatigued and requesting to return to bed. Squat pivot<bed completed with Min A. Pt was repositioned for comfort and left with fiance and all needs within reach. Bed alarm activated.   2nd Session 1:1 tx (45 min) Pt greeted supine in bed with fiance (Kristine Mosley) and sister Kristine Mosley) present. Continued family education. Fiance trained and cleared on safety plan to assist pt with toilet transfers via stand pivot and short distance ambulation with RW. Afterwards pt self propelled to large tub room and we reviewed tub shower transfers using TTB. Spouse with hands on practice during squat/stand pivot transfers from w/c and also ambulation with RW to simulate distance from living room to bathroom at home. Spouse providing min guard-close supervision assist  during these transfers. Educated spouse on pts DME needs and safe bathroom modifications to implement to maximize safety. Also reviewed w/c level kitchen access/safety in therapy apartment. Pt exhibiting carryover of OT education while conveying safety information to fiance. For a rest break, pt transferred to couch via squat pivot with steady assist and cues for w/c parts mgt. She then returned to w/c and self propelled back to room for strengthening UB. At end of session pt was left in room with fiance present.    Therapy Documentation Precautions:  Precautions Precautions: Fall Precaution Comments: TBI Restrictions Weight Bearing Restrictions: Yes RLE Weight Bearing: Non weight bearing Other Position/Activity Restrictions: no ankle, knee or hip ROM restrictions, no need for back brace ADL: ADL ADL Comments: Please see functional navigator for ADL status     See Function Navigator for Current Functional Status.   Therapy/Group: Individual Therapy  Vasily Fedewa A Caya Soberanis 11/26/2017, 12:24 PM

## 2017-11-26 NOTE — Progress Notes (Signed)
[place] PHYSICAL MEDICINE & REHABILITATION     PROGRESS NOTE    Subjective/Complaints: Patient seen lying in bed this morning. She states she did not sleep well overnight because of the lights in her room, but did not want to bother anyone to assess him to turn them off.  ROS: Denies nausea, vomiting, diarrhea, shortness of breath or chest pain   Objective: Vital Signs: Blood pressure 121/80, pulse 99, temperature 98.4 F (36.9 C), temperature source Oral, resp. rate 18, height 5\' 4"  (1.626 m), weight 54.7 kg (120 lb 9.6 oz), last menstrual period 11/17/2017, SpO2 100 %. No results found. Recent Labs    11/24/17 0647 11/25/17 0614  WBC 11.8* 13.6*  HGB 11.2* 12.2  HCT 34.8* 37.8  PLT 539* 643*   Recent Labs    11/25/17 0614  NA 138  K 4.4  CL 102  GLUCOSE 109*  BUN 12  CREATININE 0.75  CALCIUM 9.3   CBG (last 3)  No results for input(s): GLUCAP in the last 72 hours.  Wt Readings from Last 3 Encounters:  11/24/17 54.7 kg (120 lb 9.6 oz)  11/21/17 57.3 kg (126 lb 5.2 oz)  06/20/17 53.5 kg (118 lb)    Physical Exam:  Constitutional: NAD. Well-developed. HENT: Normocephalic. Scalp with scabbed laceration. Eyes:EOMare normal. No discharge.  Cardiovascular:RRR. No JVD .  Respiratory:CTA Bilaterally. Normal effort  ZO:XWRUE sounds are normal. She exhibitsno distension.  Musculoskeletal: She exhibitsedema RLE. Neurological: She isalert and oriented.  Motor: 5/5 bilateral deltoid, bicep, tricep, grip Right lower extremity is 2/5 at the hip flexors and knee extensors, wiggles toes Sensation intact light touch  Left lower extremity is 5/5 in the hip flexor knee extensor ankle dorsiflexor. Skin: RLE abrasions and incision Psychiatric: She has anormal mood and affect. Herbehavior is normal.Judgmentand thought contentnormal.     Assessment/Plan: 1. Gait disorder and functional deficits secondary to polytrauma which require 3+ hours per day of  interdisciplinary therapy in a comprehensive inpatient rehab setting. Physiatrist is providing close team supervision and 24 hour management of active medical problems listed below. Physiatrist and rehab team continue to assess barriers to discharge/monitor patient progress toward functional and medical goals.  Function:  Bathing Bathing position Bathing activity did not occur: Refused    Bathing parts      Bathing assist        Upper Body Dressing/Undressing Upper body dressing Upper body dressing/undressing activity did not occur: Refused                  Upper body assist        Lower Body Dressing/Undressing Lower body dressing   What is the patient wearing?: Shoes                 Shoes - Performed by patient: Don/doff left shoe            Lower body assist        Toileting Toileting   Toileting steps completed by patient: Adjust clothing prior to toileting, Performs perineal hygiene, Adjust clothing after toileting   Toileting Assistive Devices: Grab bar or rail  Toileting assist Assist level: Supervision or verbal cues   Transfers Chair/bed transfer   Chair/bed transfer method: Stand pivot Chair/bed transfer assist level: Touching or steadying assistance (Pt > 75%) Chair/bed transfer assistive device: Medical sales representative     Max distance: 10 ft Assist level: 2 helpers   Wheelchair   Type: Manual Max wheelchair distance:  150 ft Assist Level: Supervision or verbal cues  Cognition Comprehension Comprehension assist level: Follows complex conversation/direction with no assist  Expression Expression assist level: Expresses complex ideas: With no assist  Social Interaction Social Interaction assist level: Interacts appropriately with others - No medications needed.  Problem Solving Problem solving assist level: Solves complex problems: Recognizes & self-corrects  Memory Memory assist level: Complete Independence: No helper     Medical Problem List and Plan: 1.   Gait disorder and decline in ADL function secondary to polytrauma with right acetabular fracture, right tib-fib fracture status post IM nail, right bicondylar tibial fracture status post ORIF, right peroneal nerve injury, nonweightbearing right lower extremity, mild TBI, T12 fracture without spinal cord injury   Continue CIR  2. DVT Prophylaxis/Anticoagulation: Pharmaceutical:Lovenox 3. Pain Management:oxycodone prn. 4. Mood:Team to provide ego support to help manage anxiety.LCSW to follow for evaluation and support. 5. Neuropsych: This patientiscapable of making decisions on herown behalf. 6. Skin/Wound Care:Added protein supplement. Changed R-PRAFO to Prevalon boot.Monitor wounds/incision for healing. Routine pressure relief measures. 7. Fluids/Electrolytes/Nutrition:Monitor I/O.   BMP within acceptable range on 2/8 8. Right acetabular fracture and inferior ramus fracture: NWB RLE 9. ABLA/Left retroperitoneal hematoma: H/H improving.  10. Scalp laceration:Healing--staples removed 2/6. 11. Leucocytosis:Has completed course of Zosyn for aspiration PNA. Follow up CXR and UA negative    -wbc's up to 13.6on 2/8   -right leg suspicious for cellulitis, empiric keflex Started on 2/8  12. Thrombocytosis: Likely reactive--monitor for trend.  13. Hypokalemia: Question dilutional. resolved   LOS (Days) 2 A FACE TO FACE EVALUATION WAS PERFORMED  Ionna Avis Lorie Phenix, MD 11/26/2017 8:12 AM

## 2017-11-27 ENCOUNTER — Inpatient Hospital Stay (HOSPITAL_COMMUNITY): Payer: Medicaid Other | Admitting: Occupational Therapy

## 2017-11-27 DIAGNOSIS — D75839 Thrombocytosis, unspecified: Secondary | ICD-10-CM

## 2017-11-27 DIAGNOSIS — D473 Essential (hemorrhagic) thrombocythemia: Secondary | ICD-10-CM

## 2017-11-27 MED ORDER — NICOTINE 7 MG/24HR TD PT24
7.0000 mg | MEDICATED_PATCH | Freq: Every day | TRANSDERMAL | Status: DC
  Administered 2017-11-27 – 2017-11-29 (×3): 7 mg via TRANSDERMAL
  Filled 2017-11-27 (×3): qty 1

## 2017-11-27 NOTE — Progress Notes (Signed)
[place] PHYSICAL MEDICINE & REHABILITATION     PROGRESS NOTE    Subjective/Complaints: Patient seen sitting up in her bed this morning. She states that she slept fairly overnight because her room.. She asks me to turn up the temperature in her room. She also states "I don't poop every day at home".  ROS: Denies nausea, vomiting, diarrhea, shortness of breath or chest pain   Objective: Vital Signs: Blood pressure 130/78, pulse 84, temperature 97.9 F (36.6 C), temperature source Oral, resp. rate 18, height 5\' 4"  (1.626 m), weight 54.7 kg (120 lb 9.6 oz), last menstrual period 11/17/2017, SpO2 100 %. No results found. Recent Labs    11/25/17 0614  WBC 13.6*  HGB 12.2  HCT 37.8  PLT 643*   Recent Labs    11/25/17 0614  NA 138  K 4.4  CL 102  GLUCOSE 109*  BUN 12  CREATININE 0.75  CALCIUM 9.3   CBG (last 3)  No results for input(s): GLUCAP in the last 72 hours.  Wt Readings from Last 3 Encounters:  11/24/17 54.7 kg (120 lb 9.6 oz)  11/21/17 57.3 kg (126 lb 5.2 oz)  06/20/17 53.5 kg (118 lb)    Physical Exam:  Constitutional: NAD. Well-developed. HENT: Normocephalic. Scalp with scabbed laceration. Eyes:EOMare normal. No discharge.  Cardiovascular:RRR. No JVD .  Respiratory:CTA Bilaterally. Normal effort  TI:RWERX sounds are normal. She exhibitsno distension.  Musculoskeletal: She exhibitsedema RLE. Neurological: She isalert and oriented.  Motor: 5/5 bilateral deltoid, bicep, tricep, grip Right lower extremity is 3+/5 at the hip flexors and knee extensors, wiggles toes Sensation intact light touch  Skin: Scattered abrasions  Psychiatric: She has anormal mood and affect. Herbehavior is normal.Judgmentand thought contentnormal.     Assessment/Plan: 1. Gait disorder and functional deficits secondary to polytrauma which require 3+ hours per day of interdisciplinary therapy in a comprehensive inpatient rehab setting. Physiatrist is providing close  team supervision and 24 hour management of active medical problems listed below. Physiatrist and rehab team continue to assess barriers to discharge/monitor patient progress toward functional and medical goals.  Function:  Bathing Bathing position Bathing activity did not occur: Refused Position: Production manager parts bathed by patient: Right arm, Left arm, Chest, Abdomen, Front perineal area, Buttocks, Right upper leg, Left upper leg, Left lower leg Body parts bathed by helper: Back  Bathing assist Assist Level: Touching or steadying assistance(Pt > 75%)      Upper Body Dressing/Undressing Upper body dressing Upper body dressing/undressing activity did not occur: Refused What is the patient wearing?: Pull over shirt/dress     Pull over shirt/dress - Perfomed by patient: Thread/unthread right sleeve, Thread/unthread left sleeve, Put head through opening, Pull shirt over trunk          Upper body assist Assist Level: Set up      Lower Body Dressing/Undressing Lower body dressing   What is the patient wearing?: Underwear, Pants, Socks, Shoes, Non-skid slipper socks Underwear - Performed by patient: Thread/unthread right underwear leg, Thread/unthread left underwear leg, Pull underwear up/down   Pants- Performed by patient: Thread/unthread right pants leg, Thread/unthread left pants leg, Pull pants up/down   Non-skid slipper socks- Performed by patient: Don/doff right sock   Socks - Performed by patient: Don/doff left sock   Shoes - Performed by patient: Don/doff left shoe            Lower body assist Assist for lower body dressing: Touching or steadying assistance (Pt > 75%)  Toileting Toileting   Toileting steps completed by patient: Adjust clothing prior to toileting, Performs perineal hygiene, Adjust clothing after toileting   Toileting Assistive Devices: Grab bar or rail  Toileting assist Assist level: Supervision or verbal cues    Transfers Chair/bed transfer   Chair/bed transfer method: Squat pivot Chair/bed transfer assist level: Supervision or verbal cues Chair/bed transfer assistive device: Armrests     Locomotion Ambulation     Max distance: 35 ft Assist level: Supervision or verbal cues   Wheelchair   Type: Manual Max wheelchair distance: 150 ft  Assist Level: Supervision or verbal cues  Cognition Comprehension Comprehension assist level: Understands complex 90% of the time/cues 10% of the time  Expression Expression assist level: Expresses complex ideas: With no assist  Social Interaction Social Interaction assist level: Interacts appropriately with others - No medications needed.  Problem Solving Problem solving assist level: Solves complex problems: Recognizes & self-corrects  Memory Memory assist level: More than reasonable amount of time    Medical Problem List and Plan: 1.   Gait disorder and decline in ADL function secondary to polytrauma with right acetabular fracture, right tib-fib fracture status post IM nail, right bicondylar tibial fracture status post ORIF, right peroneal nerve injury, nonweightbearing right lower extremity, mild TBI, T12 fracture without spinal cord injury   Continue CIR    Will DC retained staple 2. DVT Prophylaxis/Anticoagulation: Pharmaceutical:Lovenox 3. Pain Management:oxycodone prn. 4. Mood:Team to provide ego support to help manage anxiety.LCSW to follow for evaluation and support. 5. Neuropsych: This patientiscapable of making decisions on herown behalf. 6. Skin/Wound Care:Added protein supplement. Changed R-PRAFO to Prevalon boot.Monitor wounds/incision for healing. Routine pressure relief measures. 7. Fluids/Electrolytes/Nutrition:Monitor I/O.   BMP within acceptable range on 2/8 8. Right acetabular fracture and inferior ramus fracture: NWB RLE 9. ABLA/Left retroperitoneal hematoma: H/H improving.  10. Scalp laceration:Healing--staples  removed 2/6. 11. Leucocytosis:Has completed course of Zosyn for aspiration PNA. Follow up CXR and UA negative    -wbc's up to 13.6 on 2/8   -right leg suspicious for cellulitis, empiric keflex Started on 2/8    Labs ordered for tomorrow 12. Thrombocytosis: Likely reactive--monitor for trend.    Labs ordered for tomorrow 13. Hypokalemia: Question dilutional. resolved   LOS (Days) 3 A FACE TO FACE EVALUATION WAS PERFORMED  Alvy Alsop Lorie Phenix, MD 11/27/2017 7:20 AM

## 2017-11-27 NOTE — Progress Notes (Signed)
Lovenox education complete with pt fiance present. Pt stated she could not stomach giving herself shots, significant other was present for teaching. Pt was educated with teach back method purpose of lovenox, length of treatment time, s/sx/adverse reactions in the event of a medical emergency. Pt fiance was able to successfully demonstrate and administer lovenox injection on pt.   Remaining staple removed from surgical site, steri strip applied.

## 2017-11-27 NOTE — Progress Notes (Signed)
Occupational Therapy Session Note  Patient Details  Name: Kristine Mosley MRN: [id number] Date of Birth: 01-Apr-1986  Today's Date: 11/27/2017 OT Individual Time: 0901-1000 OT Individual Time Calculation (min): 59 min   Short Term Goals: Week 1:  OT Short Term Goal 1 (Week 1): STGs=LTGs due to ELOS  Skilled Therapeutic Interventions/Progress Updates:    Pt greeted supine in bed. No c/o pain. Declining bathing due to lack of clean clothing from home. Worked on IADL mgt w/c level via bedmaking tasks. Pt stripping/making bed with setup of linen. Supervision when she completed squat pivot<bed to reach to opposite side for fitted sheet application. Extra time provided for problem solving and for safely navigating via w/c. Discussed continuation of w/c level IADLs at home for strengthening and psychosocial health. Afterwards, and post consultation with RN, worked on pt managing her R LE bandages as she will have to do this post shower at home. Fiance [name] present to observe. Pt requiring assist for gauze/ACE on foot but able to complete wrapping on upper leg with mod vcs for figure 8 technique. She continues to benefit from training. Also reinforced supervision needs for showering/limb wrapping with pt verbalizing understanding and relaying to [name]: "you'll always have to be with me when I do this stuff." At end of tx pt was left in bed with RN and fiance.   Therapy Documentation Precautions:  Precautions Precautions: Fall Precaution Comments: TBI Restrictions Weight Bearing Restrictions: Yes RLE Weight Bearing: Non weight bearing Other Position/Activity Restrictions: no ankle, knee or hip ROM restrictions, no need for back brace Pain: Pt reports she had no pain during session    ADL: ADL ADL Comments: Please see functional navigator for ADL status     See Function Navigator for Current Functional Status.   Therapy/Group: Individual Therapy  [name] [name] 11/27/2017, 5:25  PM

## 2017-11-27 NOTE — Plan of Care (Signed)
  Progressing RH BOWEL ELIMINATION RH STG MANAGE BOWEL WITH ASSISTANCE Description STG Manage Bowel with Assistance.Maintain regular bowel emptying. Mod I  11/27/2017 0218 - Progressing by Gwendolyn Grant, RN RH SKIN INTEGRITY RH STG SKIN FREE OF INFECTION/BREAKDOWN Description With min assist  11/27/2017 0218 - Progressing by Gwendolyn Grant, RN RH PAIN MANAGEMENT RH STG PAIN MANAGED AT OR BELOW PT'S PAIN GOAL Description Pain remains below 3  11/27/2017 0218 - Progressing by Gwendolyn Grant, RN

## 2017-11-28 ENCOUNTER — Inpatient Hospital Stay (HOSPITAL_COMMUNITY): Payer: Medicaid Other | Admitting: Occupational Therapy

## 2017-11-28 ENCOUNTER — Inpatient Hospital Stay (HOSPITAL_COMMUNITY): Payer: Medicaid Other | Admitting: Physical Therapy

## 2017-11-28 DIAGNOSIS — S069X3D Unspecified intracranial injury with loss of consciousness of 1 hour to 5 hours 59 minutes, subsequent encounter: Secondary | ICD-10-CM

## 2017-11-28 DIAGNOSIS — S22088S Other fracture of T11-T12 vertebra, sequela: Secondary | ICD-10-CM

## 2017-11-28 LAB — CBC WITH DIFFERENTIAL/PLATELET
BASOS PCT: 3 %
Basophils Absolute: 0.2 10*3/uL — ABNORMAL HIGH (ref 0.0–0.1)
EOS ABS: 0.3 10*3/uL (ref 0.0–0.7)
Eosinophils Relative: 5 %
HCT: 35.8 % — ABNORMAL LOW (ref 36.0–46.0)
HEMOGLOBIN: 11.3 g/dL — AB (ref 12.0–15.0)
Lymphocytes Relative: 31 %
Lymphs Abs: 1.9 10*3/uL (ref 0.7–4.0)
MCH: 28.6 pg (ref 26.0–34.0)
MCHC: 31.6 g/dL (ref 30.0–36.0)
MCV: 90.6 fL (ref 78.0–100.0)
MONO ABS: 0.7 10*3/uL (ref 0.1–1.0)
Monocytes Relative: 11 %
NEUTROS ABS: 3 10*3/uL (ref 1.7–7.7)
Neutrophils Relative %: 50 %
PLATELETS: 564 10*3/uL — AB (ref 150–400)
RBC: 3.95 MIL/uL (ref 3.87–5.11)
RDW: 16 % — ABNORMAL HIGH (ref 11.5–15.5)
WBC: 6.1 10*3/uL (ref 4.0–10.5)

## 2017-11-28 LAB — BASIC METABOLIC PANEL
Anion gap: 11 (ref 5–15)
BUN: 12 mg/dL (ref 6–20)
CALCIUM: 9 mg/dL (ref 8.9–10.3)
CO2: 26 mmol/L (ref 22–32)
CREATININE: 0.64 mg/dL (ref 0.44–1.00)
Chloride: 103 mmol/L (ref 101–111)
GFR calc Af Amer: >60 mL/min (ref >60)
GFR calc non Af Amer: >60 mL/min (ref >60)
GLUCOSE: 99 mg/dL (ref 65–99)
Potassium: 4.1 mmol/L (ref 3.5–5.1)
Sodium: 140 mmol/L (ref 135–145)

## 2017-11-28 NOTE — Progress Notes (Signed)
[place] PHYSICAL MEDICINE & REHABILITATION     PROGRESS NOTE    Subjective/Complaints: Upset because mother-n-law? Took daughter with husband to [place] area (they have joint custody). In tears in bed.   ROS: pt denies nausea, vomiting, diarrhea, cough, shortness of breath or chest pain     Objective: Vital Signs: Blood pressure 112/76, pulse 88, temperature 98.9 F (37.2 C), temperature source Oral, resp. rate 18, height 5\' 4"  (1.626 m), weight 54.7 kg (120 lb 9.6 oz), last menstrual period 11/17/2017, SpO2 99 %. No results found. Recent Labs    11/28/17 0519  WBC 6.1  HGB 11.3*  HCT 35.8*  PLT 564*   Recent Labs    11/28/17 0519  NA 140  K 4.1  CL 103  GLUCOSE 99  BUN 12  CREATININE 0.64  CALCIUM 9.0   CBG (last 3)  No results for input(s): GLUCAP in the last 72 hours.  Wt Readings from Last 3 Encounters:  11/24/17 54.7 kg (120 lb 9.6 oz)  11/21/17 57.3 kg (126 lb 5.2 oz)  06/20/17 53.5 kg (118 lb)    Physical Exam:  Constitutional: NAD. Well-developed. HENT: Normocephalic. Scalp with scabbed laceration. Eyes:EOMare normal. No discharge.  Cardiovascular:RRR without murmur. No JVD  .  Respiratory:CTA Bilaterally. Normal effort  YQ:IHKVQ sounds are normal. She exhibitsno distension.  Musculoskeletal: She exhibitsedema RLE. Neurological: She isalert and oriented.  Motor: 5/5 bilateral deltoid, bicep, tricep, grip Right lower extremity is 2/5 at the hip flexors and knee extensors, wiggles toes Sensation intact light touch  Left lower extremity is 5/5 in the hip flexor knee extensor ankle dorsiflexor. Skin: RLE abrasions and incision--- decreased erythema Psychiatric: anxious, tearful.       Assessment/Plan: 1. Gait disorder and functional deficits secondary to polytrauma which require 3+ hours per day of interdisciplinary therapy in a comprehensive inpatient rehab setting. Physiatrist is providing close team supervision and 24 hour  management of active medical problems listed below. Physiatrist and rehab team continue to assess barriers to discharge/monitor patient progress toward functional and medical goals.  Function:  Bathing Bathing position Bathing activity did not occur: Refused Position: Production manager parts bathed by patient: Right arm, Left arm, Chest, Abdomen, Front perineal area, Buttocks, Right upper leg, Left upper leg, Left lower leg Body parts bathed by helper: Back  Bathing assist Assist Level: Touching or steadying assistance(Pt > 75%)      Upper Body Dressing/Undressing Upper body dressing Upper body dressing/undressing activity did not occur: Refused What is the patient wearing?: Pull over shirt/dress     Pull over shirt/dress - Perfomed by patient: Thread/unthread right sleeve, Thread/unthread left sleeve, Put head through opening, Pull shirt over trunk          Upper body assist Assist Level: Set up      Lower Body Dressing/Undressing Lower body dressing   What is the patient wearing?: Underwear, Pants, Socks, Shoes, Non-skid slipper socks Underwear - Performed by patient: Thread/unthread right underwear leg, Thread/unthread left underwear leg, Pull underwear up/down   Pants- Performed by patient: Thread/unthread right pants leg, Thread/unthread left pants leg, Pull pants up/down   Non-skid slipper socks- Performed by patient: Don/doff right sock   Socks - Performed by patient: Don/doff left sock   Shoes - Performed by patient: Don/doff left shoe            Lower body assist Assist for lower body dressing: Touching or steadying assistance (Pt > 75%)      Toileting  Toileting   Toileting steps completed by patient: Adjust clothing prior to toileting, Performs perineal hygiene, Adjust clothing after toileting   Toileting Assistive Devices: Grab bar or rail  Toileting assist Assist level: Supervision or verbal cues   Transfers Chair/bed transfer   Chair/bed  transfer method: Squat pivot Chair/bed transfer assist level: Supervision or verbal cues Chair/bed transfer assistive device: Armrests     Locomotion Ambulation     Max distance: 35 ft Assist level: Supervision or verbal cues   Wheelchair   Type: Manual Max wheelchair distance: 150 ft  Assist Level: Supervision or verbal cues  Cognition Comprehension Comprehension assist level: Understands basic 90% of the time/cues < 10% of the time  Expression Expression assist level: Expresses complex ideas: With no assist  Social Interaction Social Interaction assist level: Interacts appropriately with others - No medications needed.  Problem Solving Problem solving assist level: Solves complex problems: Recognizes & self-corrects  Memory Memory assist level: More than reasonable amount of time    Medical Problem List and Plan: 1.   Gait disorder and decline in ADL function secondary to polytrauma with right acetabular fracture, right tib-fib fracture status post IM nail, right bicondylar tibial fracture status post ORIF, right peroneal nerve injury, nonweightbearing right lower extremity, mild TBI, T12 fracture without spinal cord injury   Continue CIR    -Reviewed situation with patient.  I was able to get her to settle down a bit in regards to the nature of what was going on.  Perhaps social worker can help make contact with husband and daughter. 2. DVT Prophylaxis/Anticoagulation: Pharmaceutical:Lovenox 3. Pain Management:oxycodone prn. 4. Mood:Team to provide ego support to help manage anxiety.LCSW to follow for evaluation and support. 5. Neuropsych: This patientiscapable of making decisions on herown behalf. 6. Skin/Wound Care:Added protein supplement. Changed R-PRAFO to Prevalon boot.Monitor wounds/incision for healing. Routine pressure relief measures. 7. Fluids/Electrolytes/Nutrition:Monitor I/O.   BMP within acceptable range on 2/8 8. Right acetabular fracture and  inferior ramus fracture: NWB RLE 9. ABLA/Left retroperitoneal hematoma: H/H improving.  10. Scalp laceration:Healing--staples removed 2/6. 11. Leucocytosis:Has completed course of Zosyn for aspiration PNA. Follow up CXR and UA negative    -wbc's up to 13.6on 2/8--- down to 6.1 today   -right leg suspicious for cellulitis, empiric keflex Started on 2/8--showing some improvement 12. Thrombocytosis: Likely reactive--monitor for trend.  13. Hypokalemia: Question dilutional. resolved   LOS (Days) 4 A FACE TO FACE EVALUATION WAS PERFORMED  Meredith Staggers, MD 11/28/2017 8:37 AM

## 2017-11-28 NOTE — Plan of Care (Signed)
  Progressing RH BOWEL ELIMINATION RH STG MANAGE BOWEL WITH ASSISTANCE Description STG Manage Bowel with Assistance.Maintain regular bowel emptying. Mod I  11/28/2017 0055 - Progressing by Gwendolyn Grant, RN RH STG MANAGE BOWEL W/MEDICATION W/ASSISTANCE Description STG Manage Bowel with Medication with Assistance. 11/28/2017 0055 - Progressing by Gwendolyn Grant, RN RH SKIN INTEGRITY RH STG SKIN FREE OF INFECTION/BREAKDOWN Description With min assist  11/28/2017 0055 - Progressing by Gwendolyn Grant, RN RH STG ABLE TO PERFORM INCISION/WOUND CARE W/ASSISTANCE Description STG Able To Perform Incision/Wound Care With Assistance. 11/28/2017 0055 - Progressing by Gwendolyn Grant, RN RH PAIN MANAGEMENT RH STG PAIN MANAGED AT OR BELOW PT'S PAIN GOAL Description Pain remains below 3  11/28/2017 0055 - Progressing by Gwendolyn Grant, RN

## 2017-11-28 NOTE — Progress Notes (Signed)
RN observed patient was crying and upset at beginning of shift. Pt stated one her children was taken by her mother in law without her consent. Later tonight, pt requested to go out for a few minutes to get some fresh air. She also mentioned that she has not smoked since she got admitted but used to smoke 1/2 ppd. Dr. Posey Pronto informed and ordered for nicotine patch.

## 2017-11-28 NOTE — Progress Notes (Signed)
Occupational Therapy Session Note  Patient Details  Name: Kristine Mosley MRN: [id number] Date of Birth: 11-28-1985  Today's Date: 11/28/2017 OT Individual Time: 0900-1011 and 4818-5631 OT Individual Time Calculation (min): 71 min and 44 min    Short Term Goals: Week 1:  OT Short Term Goal 1 (Week 1): STGs=LTGs due to ELOS  Skilled Therapeutic Interventions/Progress Updates:    Session 1: Upon entering the room, pt supine in bed and very upset over childcare matter with family members. OT discussing various coping strategies for anxiety and pt's current state. Pt verbalized various methods she attempts to utilize during these times. Pt declined bathing and dressing during this session. Pt transferred into wheelchair from bed with supervision squat pivot transfer. Pt able to manage wheelchair leg rests with increased time. This session with focus on community mobility with use of wheelchair. Pt following directions to get on elevator and go to gift shop. Pt propelled wheelchair in narrow aisles and sharp corners with increased time. Pt picking up items from various shelf heights. OT began education regarding energy conservation with community mobility. Pt returning to room and opening dressers to pick out clean clothing for next session after shower. Pt transferred from wheelchair >bed with supervision and call bell within reach. Bed alarm activated.   Session 2: Upon entering the room, pt supine on bed with PT present and transitioning easily to OT session. Pt with plans to shower this session. Pt ambulates 10' with RW to bathroom with supervision. Pt transferred onto TTB with supervision and removing clothing items from seated position for safety. Pt bathing self and performing lateral lean to wash buttocks. Pt drying and transferring to sit on commode to don clothing items with overall supervision as well. Pt returning to bed and pink foam dressing placed on R heel, gaze places, and  compression wraps for R LE. Pt remained in bed with sister present and all needs within reach. Bed alarm activated.   Therapy Documentation Precautions:  Precautions Precautions: Fall Precaution Comments: TBI Restrictions Weight Bearing Restrictions: Yes RLE Weight Bearing: Non weight bearing Other Position/Activity Restrictions: no ankle, knee or hip ROM restrictions, no need for back brace ADL: ADL ADL Comments: Please see functional navigator for ADL status  See Function Navigator for Current Functional Status.   Therapy/Group: Individual Therapy  Gypsy Decant 11/28/2017, 10:16 AM
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
## 2017-11-28 NOTE — Care Management Note (Signed)
[facility] Individual Statement of Services  Patient Name:  Kristine Mosley  Date:  11/28/2017  Welcome to the [facility].  Our goal is to provide you with an individualized program based on your diagnosis and situation, designed to meet your specific needs.  With this comprehensive rehabilitation program, you will be expected to participate in at least 3 hours of rehabilitation therapies Monday-Friday, with modified therapy programming on the weekends.  Your rehabilitation program will include the following services:  Physical Therapy (PT), Occupational Therapy (OT), Speech Therapy (ST), 24 hour per day rehabilitation nursing, Therapeutic Recreaction (TR), Neuropsychology, Case Management (Social Worker), Rehabilitation Medicine, Nutrition Services and Pharmacy Services  Weekly team conferences will be held on Tuesdays to discuss your progress.  Your Social Worker will talk with you frequently to get your input and to update you on team discussions.  Team conferences with you and your family in attendance may also be held.  Expected length of stay: 7 days   Overall anticipated outcome: modified independent  Depending on your progress and recovery, your program may change. Your Social Worker will coordinate services and will keep you informed of any changes. Your Social Worker's name and contact numbers are listed  below.  The following services may also be recommended but are not provided by the [facility] will be made to provide these services after discharge if needed.  Arrangements include referral to agencies that provide these services.  Your insurance has been verified to be:  Medicaid Your primary doctor is:  [facility].  Pertinent information will be  shared with your doctor and your insurance company.  Social Worker:  [place], [place] or ([phone]   Information discussed with and copy given to patient by: Lennart Pall, 11/28/2017, 2:53 PM

## 2017-11-28 NOTE — Progress Notes (Addendum)
Physical Therapy Session Note  Patient Details  Name: Kristine Mosley MRN: [id number] Date of Birth: 1986/09/04  Today's Date: 11/28/2017 PT Individual Time: 1030-1100 and 1410-1504 PT Individual Time Calculation (min): 30 min and 54 min   Short Term Goals: Week 1:  PT Short Term Goal 1 (Week 1): =LTG due to ELOS  Skilled Therapeutic Interventions/Progress Updates:  Treatment 1: Pt received in bed & agreeable to tx. No c/o pain reported at rest; pt does note slight soreness in RLE after gait 2/2 dependent position. Pt transferred [place] with mod I & completed squat pivot to w/c with set up assist; pt requires cuing to make sure w/c is locked prior to transferring. Pt propelled w/c room<>gym with Mod I. Gait x 100 ft + 100 ft with supervision with pt reporting L elbow popping 2 times during first trial but reports this is normal PTA. At end of session pt returned to room & bed in same manner as noted above. Pt left in bed with alarm set & all needs within reach.  Treatment 2: Pt received in room with friend present for session. No c/o pain reported. Pt frustrated with personal issues occurring at home with therapist attempting to provide distraction & redirection throughout session. Pt requesting to get a drink from vending machine & pt propelled w/c unit<>3rd floor central zone & purchased drink with Mod I. In gym pt negotiated single step (3" as pt reports this is what she has to access back door at home) with RW forwards and backwards with close supervision. Pt requested to return to room to perform LE therapeutic exercises. Pt completes w/c>bed with supervision via stand pivot. Instructed pt in performing R ankle dorsiflexion stretch with towel and provided education on duration and repetitions of stretch. Instructed pt in RLE heel slides with pt reporting "popping" in lateral aspect of distal leg & PA made aware & assessed.  Educated pt on need to wear boot at home to help prevent R foot drop.  Also instructed pt in RLE straight leg raises but pt would benefit from handout and further practice tomorrow. At end of session pt left in care of PA & in handoff to OT.  Per pt request a grounds past was requested and PA made aware.   Therapy Documentation Precautions:  Precautions Precautions: Fall Precaution Comments: TBI Restrictions Weight Bearing Restrictions: Yes RLE Weight Bearing: Non weight bearing Other Position/Activity Restrictions: no ankle, knee or hip ROM restrictions, no need for back brace   See Function Navigator for Current Functional Status.   Therapy/Group: Individual Therapy  [name] 11/28/2017, 4:13 PM

## 2017-11-29 ENCOUNTER — Inpatient Hospital Stay (HOSPITAL_COMMUNITY): Payer: Medicaid Other | Admitting: Physical Therapy

## 2017-11-29 ENCOUNTER — Inpatient Hospital Stay (HOSPITAL_COMMUNITY): Payer: Medicaid Other | Admitting: Occupational Therapy

## 2017-11-29 MED ORDER — LIDOCAINE-PRILOCAINE 2.5-2.5 % EX CREA
TOPICAL_CREAM | Freq: Once | CUTANEOUS | Status: AC
  Administered 2017-11-29: 18:00:00 via TOPICAL
  Filled 2017-11-29: qty 5

## 2017-11-29 MED ORDER — OXYCODONE HCL 5 MG PO TABS: 5.0000 mg | ORAL_TABLET | ORAL | Status: DC | PRN

## 2017-11-29 NOTE — Progress Notes (Addendum)
Physical Therapy Session Note  Patient Details  Name: Kristine Mosley MRN: [id number] Date of Birth: February 11, 1986  Today's Date: 11/29/2017 PT Individual Time: 1300-1340 PT Individual Time Calculation (min): 40 min   Short Term Goals: Week 1:  PT Short Term Goal 1 (Week 1): =LTG due to ELOS  Skilled Therapeutic Interventions/Progress Updates:   Pt supine and agreeable to therapy, denies pain. Transferred to w/c Mod I and self-propelled w/c around unit at Mod I level, including maneuvering tight spaces and ramp, to work on general strengthening and endurance. Practiced negotiating 6" steps x4 w/ bilateral handrails w/ supervision. Set up obstacle course to mimic household mobility using RW for gait, Mod I. Performed arm bike @ L 2, 5 min x2, to work on general endurance and strengthening. Returned to room and ended session in supine, call bell within reach and all needs met.   Therapy Documentation Precautions:  Precautions Precautions: Fall Precaution Comments: TBI Restrictions Weight Bearing Restrictions: Yes RLE Weight Bearing: Non weight bearing Other Position/Activity Restrictions: no ankle, knee or hip ROM restrictions, no need for back brace  See Function Navigator for Current Functional Status.   Therapy/Group: Individual Therapy  [name] 11/29/2017, 1:39 PM
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
## 2017-11-29 NOTE — Progress Notes (Signed)
Physical Therapy Discharge Summary  Patient Details  Name: Kristine Mosley MRN: [id number] Date of Birth: 18-Dec-1985  Today's Date: 11/29/2017 PT Individual Time: 0805-0900 and 1400-1430 PT Individual Time Calculation (min): 55 min and 30 min    Patient has met 7 of 7 long term goals due to improved activity tolerance, improved balance, improved postural control, increased strength, ability to compensate for deficits, improved attention, improved awareness and improved coordination.  Patient to discharge at mod I w/c level for community mobility, and ambulatory with RW & Mod I for household mobility.   Patient's fiance Audiological scientist) has been present for caregiver education and has been educated on how to assist pt with stair negotiation and other functional mobility tasks.  Reasons goals not met: n/a  Recommendation:  Patient will benefit from ongoing skilled PT services in outpatient setting to continue to advance safe functional mobility, address ongoing impairments in decreased balance, decreased strength & ROM in RLE, decreased endurance, and minimize fall risk.  Equipment: 16x18 w/c, RW  Reasons for discharge: treatment goals met  Patient/family agrees with progress made and goals achieved: Yes   Skilled PT Treatment Treatment 1: Pt received in bed & agreeable to tx. Pt denied c/o pain. Pt made Mod I in room by OT this morning. Pt ambulated throughout room and bathroom with RW & had continent void on toilet. Pt elected to perform hand hygiene from w/c level for increased safety. Pt completed all grad day activities with supervision<>mod I overall with AD (please see below for details). Pt continues to fatigue quickly and require seated rest breaks as a result. Pt denied any questions or concerns regarding d/c home tomorrow; educated pt on need to reduce tripping hazards at a home (remove or secure throw rugs, remove obstacles in floor). Pt completed furniture transfer from low,  compliant couch and car transfer from low sedan simulated height. Pt able to ambulate laterally with RW to simulate narrow spaces in home environment. At end of session pt left in bed with all needs within reach.  Treatment 2: Pt received in bed & agreeable to tx. Notes discomfort in RLE. Therapist assessed ROM RLE (pt unable to achieve neutral with R ankle even with PROM and unable to fully extend R knee AROM). Provided pt with HEP & pt completed the following with instructional cuing from therapist: R ankle dorsiflexion stretches, quad sets, heel slides, straight leg raises, hip abduction slides, short arc quads, and long arc quads. At end of session pt left in bed with all needs within reach.  PT Discharge Precautions/Restrictions Precautions Precautions: Fall Restrictions Weight Bearing Restrictions: Yes RLE Weight Bearing: Non weight bearing Other Position/Activity Restrictions: no ankle, knee or hip ROM restrictions, no need for back brace  Vision/Perception  Pt reports she wears glasses at all times at baseline and denies any changes in baseline vision.  Cognition Overall Cognitive Status: Within Functional Limits for tasks assessed Arousal/Alertness: Awake/alert Orientation Level: Oriented X4 Memory: Appears intact Awareness: Appears intact Problem Solving: Appears intact Safety/Judgment: Appears intact  Sensation Sensation Light Touch: Appears Intact(reports numbness in RLE when in dependent position during standing/gait) Proprioception: Appears Intact Coordination Gross Motor Movements are Fluid and Coordinated: Yes  Motor  Motor Motor: Other (comment) Motor - Discharge Observations: decreased endurance, decreased strength RLE, foot drop RLE   Mobility Bed Mobility Bed Mobility: Rolling Right;Rolling Left;Supine to Sit;Sit to Supine Rolling Right: 6: Modified independent (Device/Increase time) Rolling Left: 6: Modified independent (Device/Increase time) Supine to  Sit: 6: Modified  independent (Device/Increase time) Sit to Supine: 6: Modified independent (Device/Increase time) Transfers Transfers: Yes Sit to Stand: 6: Modified independent (Device/Increase time);With armrests Stand to Sit: 6: Modified independent (Device/Increase time);With armrests Squat Pivot Transfers: 6: Modified independent (Device/Increase time)   Locomotion  Ambulation Ambulation: Yes Ambulation/Gait Assistance: 6: Modified independent (Device/Increase time) Ambulation Distance (Feet): 80 Feet Assistive device: Rolling walker Gait Gait: Yes Gait Pattern: (NWB RLE) Stairs / Additional Locomotion Stairs: Yes Stairs Assistance: 5: Supervision Stair Management Technique: Two rails Number of Stairs: 8 Height of Stairs: 3(inches) Ramp: 6: Modified independent (Device)(ambulatory with RW) Curb: 5: Supervision(3" curb with RW to simulate home access at backdoor which therapist recommends she use) Architect: Yes Wheelchair Assistance: 6: Modified independent (Device/Increase time) Environmental health practitioner: Both upper extremities Wheelchair Parts Management: Independent Distance: >150 ft   Balance Balance Balance Assessed: Yes Dynamic Standing Balance Dynamic Standing - Balance Support: Bilateral upper extremity supported Dynamic Standing - Level of Assistance: 6: Modified independent (Device/Increase time) Dynamic Standing - Balance Activities: (during gait)   Extremity Assessment  RUE Assessment RUE Assessment: Within Functional Limits LUE Assessment LUE Assessment: Within Functional Limits RLE Assessment RLE Assessment: (foot drop RLE, unable to achieve neutral dorsiflexion even with PROM, unable to achieve full extension with R knee 2/2 discomfort) LLE Assessment LLE Assessment: Within Functional Limits   See Function Navigator for Current Functional Status.  Kristine Mosley 11/29/2017, 2:44 PM

## 2017-11-29 NOTE — Progress Notes (Signed)
Occupational Therapy Discharge Summary  Patient Details  Name: Kristine Mosley MRN: [id number] Date of Birth: December 30, 1985  Today's Date: 11/29/2017 OT Individual Time: 2909-0301 OT Individual Time Calculation (min): 58 min    Patient has met 10 of 10 long term goals due to improved activity tolerance, improved balance, ability to compensate for deficits, improved attention, improved awareness and improved coordination.  Patient to discharge at overall Modified Independent level.    Reasons goals not met:   Recommendation:  Patient will benefit from ongoing skilled OT services in outpatient setting to continue to advance functional skills in the area of BADL and iADL.  Equipment: TTB  Reasons for discharge: treatment goals met  Patient/family agrees with progress made and goals achieved: Yes   OT Intervention: Pt supine in bed upon entering the room. Pt donning LB clothing and shoes in long sitting independently. Pt performed stand pivot transfer from bed >wheelchair at mod I level with RW. Pt propelled wheelchair to ADL apartment and demonstrated use of RW for transfer onto TTB without assistance. Pt propelling wheelchair to day room at mod I level and eating breakfast while therapist educated pt on energy conservation with self care, OT d/c recommendations, and equipment recommendations based on home set up and needs. Pt asking questions as needed and returning to room in same manner as above. Pt made mod I in room for functional transfers. Call bell and all needed items within reach.     OT Discharge Precautions/Restrictions  Precautions Precautions: Fall Restrictions Weight Bearing Restrictions: Yes RLE Weight Bearing: Non weight bearing Other Position/Activity Restrictions: no ankle, knee or hip ROM restrictions, no need for back brace Pain Pain Assessment Pain Assessment: No/denies pain ADL ADL ADL Comments: Please see functional navigator for ADL  status Vision Baseline Vision/History: Wears glasses Wears Glasses: At all times Patient Visual Report: No change from baseline Vision Assessment?: No apparent visual deficits Cognition Overall Cognitive Status: Within Functional Limits for tasks assessed Arousal/Alertness: Awake/alert Orientation Level: Oriented X4 Sensation Sensation Light Touch: Appears Intact Stereognosis: Not tested Hot/Cold: Appears Intact Proprioception: Appears Intact Coordination Gross Motor Movements are Fluid and Coordinated: No Fine Motor Movements are Fluid and Coordinated: Yes Motor  Motor Motor: Other (comment) Motor - Discharge Observations: generalized weakness Mobility  Transfers Transfers: Sit to Stand;Stand to Sit Sit to Stand: 6: Modified independent (Device/Increase time) Stand to Sit: 6: Modified independent (Device/Increase time)  Trunk/Postural Assessment  Cervical Assessment Cervical Assessment: Within Functional Limits Thoracic Assessment Thoracic Assessment: Exceptions to WFL(kyphotic) Lumbar Assessment Lumbar Assessment: Exceptions to WFL(posterior pelvic tilt) Postural Control Postural Control: Within Functional Limits  Balance Balance Balance Assessed: Yes Dynamic Sitting Balance Dynamic Sitting - Level of Assistance: 6: Modified independent (Device/Increase time) Static Standing Balance Static Standing - Level of Assistance: 6: Modified independent (Device/Increase time) Dynamic Standing Balance Dynamic Standing - Level of Assistance: 6: Modified independent (Device/Increase time) Extremity/Trunk Assessment RUE Assessment RUE Assessment: Within Functional Limits LUE Assessment LUE Assessment: Within Functional Limits   See Function Navigator for Current Functional Status.  Gypsy Decant 11/29/2017, 7:48 AM

## 2017-11-29 NOTE — Progress Notes (Signed)
Sutures removed per order. Patient tolerated procedure. Suture sites clean and dry.
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
## 2017-11-30 LAB — BASIC METABOLIC PANEL
ANION GAP: 12 (ref 5–15)
BUN: 14 mg/dL (ref 6–20)
CO2: 25 mmol/L (ref 22–32)
Calcium: 9.1 mg/dL (ref 8.9–10.3)
Chloride: 103 mmol/L (ref 101–111)
Creatinine, Ser: 0.69 mg/dL (ref 0.44–1.00)
GFR calc Af Amer: >60 mL/min (ref >60)
GLUCOSE: 101 mg/dL — AB (ref 65–99)
POTASSIUM: 4 mmol/L (ref 3.5–5.1)
Sodium: 140 mmol/L (ref 135–145)

## 2017-11-30 LAB — CBC
HEMATOCRIT: 35.4 % — AB (ref 36.0–46.0)
Hemoglobin: 11.2 g/dL — ABNORMAL LOW (ref 12.0–15.0)
MCH: 28.8 pg (ref 26.0–34.0)
MCHC: 31.6 g/dL (ref 30.0–36.0)
MCV: 91 fL (ref 78.0–100.0)
Platelets: 529 10*3/uL — ABNORMAL HIGH (ref 150–400)
RBC: 3.89 MIL/uL (ref 3.87–5.11)
RDW: 16.1 % — ABNORMAL HIGH (ref 11.5–15.5)
WBC: 4.5 10*3/uL (ref 4.0–10.5)

## 2017-11-30 MED ORDER — PANTOPRAZOLE SODIUM 40 MG PO TBEC: 40.0000 mg | DELAYED_RELEASE_TABLET | Freq: Every day | ORAL | 0 refills | Status: DC

## 2017-11-30 MED ORDER — TRAMADOL HCL 50 MG PO TABS: 50.0000 mg | ORAL_TABLET | Freq: Four times a day (QID) | ORAL | 0 refills | Status: DC | PRN

## 2017-11-30 MED ORDER — ENOXAPARIN SODIUM 40 MG/0.4ML ~~LOC~~ SOLN: 40.0000 mg | SUBCUTANEOUS | 0 refills | Status: DC

## 2017-11-30 MED ORDER — ADULT MULTIVITAMIN W/MINERALS CH: 1.0000 | ORAL_TABLET | Freq: Every day | ORAL | 0 refills | Status: AC

## 2017-11-30 MED ORDER — THIAMINE HCL 100 MG PO TABS: 100.0000 mg | ORAL_TABLET | Freq: Every day | ORAL | 0 refills | Status: DC

## 2017-11-30 MED ORDER — FOLIC ACID 1 MG PO TABS: 1.0000 mg | ORAL_TABLET | Freq: Every day | ORAL | 0 refills | Status: DC

## 2017-11-30 MED ORDER — CEPHALEXIN 250 MG PO CAPS: 250.0000 mg | ORAL_CAPSULE | Freq: Four times a day (QID) | ORAL | 0 refills | Status: DC

## 2017-11-30 MED ORDER — ACETAMINOPHEN 325 MG PO TABS: 325.0000 mg | ORAL_TABLET | ORAL | 0 refills | Status: AC | PRN
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
## 2017-11-30 NOTE — Progress Notes (Signed)
Patient arrived to unit and oriented to schedule and safety protocol. Questions answered. No questions at this time.

## 2017-11-30 NOTE — Progress Notes (Signed)
Patient and significant other verbalized understanding of discharge instructions as given by Algis Liming, PA.  Prescriptions given to patient to take to desired pharmacy.  Awaiting on DME to be brought up before leaving.  Patient appears to be in no immediate distress at this time.  Patient will be discharged as soon as all equipment is brought to room.  Brita Romp, RN
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
## 2017-11-30 NOTE — Progress Notes (Signed)
Social Work  Discharge Note  The overall goal for the admission was met for:   Discharge location: Yes - home with fiance and sister to provide any needed assistance  Length of Stay: Yes - 6 days  Discharge activity level: Yes - modified independent  Home/community participation: Yes  Services provided included: MD, RD, PT, OT, SLP, RN, Pharmacy, Neuropsych and SW  Financial Services: Medicaid  Follow-up services arranged: Home Health: RN, PT, OT via [facility], DME: [phone] lightweight w/c, cushion, walker and tub bench via [facility] and Patient/Family has no preference for HH/DME agencies  Comments (or additional information):  Patient/Family verbalized understanding of follow-up arrangements: Yes  Individual responsible for coordination of the follow-up plan: pt  Confirmed correct DME delivered: Kristine Mosley 11/30/2017    Satoshi Kalas

## 2017-11-30 NOTE — Plan of Care (Signed)
  Completed/Met RH BOWEL ELIMINATION RH STG MANAGE BOWEL WITH ASSISTANCE Description STG Manage Bowel with Assistance.Maintain regular bowel emptying. Mod I  11/30/2017 8016 - Completed/Met by Brita Romp, RN RH STG MANAGE BOWEL W/MEDICATION W/ASSISTANCE Description STG Manage Bowel with Medication with Assistance. 11/30/2017 5537 - Completed/Met by Brita Romp, RN RH SKIN INTEGRITY RH STG SKIN FREE OF INFECTION/BREAKDOWN Description With min assist  11/30/2017 4827 - Completed/Met by Brita Romp, RN RH STG ABLE TO PERFORM INCISION/WOUND CARE W/ASSISTANCE Description STG Able To Perform Incision/Wound Care With Assistance. 11/30/2017 0786 - Completed/Met by Brita Romp, RN RH PAIN MANAGEMENT RH STG PAIN MANAGED AT OR BELOW PT'S PAIN GOAL Description Pain remains below 3  11/30/2017 0823 - Completed/Met by Brita Romp, RN

## 2017-11-30 NOTE — Discharge Instructions (Signed)
Inpatient Rehab Discharge Instructions  Jaylea Plourde Discharge date and time:  11/30/17  Activities/Precautions/ Functional Status: Activity: no lifting, driving, or strenuous exercise till cleared by MD. No weight on right leg. Diet: regular diet Wound Care:    Functional status:  ___ No restrictions     ___ Walk up steps independently ___ 24/7 supervision/assistance   ___ Walk up steps with assistance _X__ Intermittent supervision/assistance  _X__ Bathe/dress independently ___ Walk with walker     ___ Bathe/dress with assistance ___ Walk Independently    ___ Shower independently ___ Walk with assistance    ___ Shower with assistance _X__ No alcohol     ___ Return to work/school ________    COMMUNITY REFERRALS UPON DISCHARGE:    Home Health:   PT     OT      RN                     Agency:  [facility] Phone: [phone]   Medical Equipment/Items Ordered: wheelchair, cushion, rolling walker, tub bench                                                     Agency/Supplier:  [facility] @ [phone]   Special Instructions: 1. Absolutely no alcohol or tobacco products.      My questions have been answered and I understand these instructions. I will adhere to these goals and the provided educational materials after my discharge from the hospital.  Patient/Caregiver Signature _______________________________ Date __________  Clinician Signature _______________________________________ Date __________  Please bring this form and your medication list with you to all your follow-up doctor's appointments.

## 2017-12-08 ENCOUNTER — Telehealth: Payer: Self-pay

## 2017-12-08 NOTE — Telephone Encounter (Signed)
Kristine Mosley called requesting verbal orders for 1xwk X 1wks followed by 2xwk X 2wks.   Called her back and approved verbal orders.

## 2017-12-14 ENCOUNTER — Encounter (HOSPITAL_COMMUNITY): Payer: Self-pay | Admitting: Emergency Medicine

## 2017-12-14 ENCOUNTER — Ambulatory Visit (HOSPITAL_COMMUNITY)
Admission: EM | Admit: 2017-12-14 | Discharge: 2017-12-14 | Disposition: A | Discharge status: Home / Self Care | Payer: Medicaid Other | Attending: Urgent Care | Admitting: Urgent Care

## 2017-12-14 ENCOUNTER — Other Ambulatory Visit: Payer: Self-pay

## 2017-12-14 DIAGNOSIS — S0101XD Laceration without foreign body of scalp, subsequent encounter: Secondary | ICD-10-CM

## 2017-12-14 DIAGNOSIS — L089 Local infection of the skin and subcutaneous tissue, unspecified: Secondary | ICD-10-CM

## 2017-12-14 DIAGNOSIS — T148XXA Other injury of unspecified body region, initial encounter: Principal | ICD-10-CM

## 2017-12-14 MED ORDER — CEPHALEXIN 500 MG PO CAPS: 500.0000 mg | ORAL_CAPSULE | Freq: Three times a day (TID) | ORAL | 0 refills | Status: DC
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
## 2017-12-14 NOTE — Discharge Instructions (Signed)
You may take 500mg Tylenol every 6 hours for pain and inflammation. ° °

## 2017-12-16 ENCOUNTER — Telehealth: Payer: Self-pay | Admitting: *Deleted

## 2017-12-16 NOTE — Telephone Encounter (Signed)
OT requesting verbal orders for 1 visit to provide equipment and education for patient. Verbal orders given per office protocol

## 2017-12-22 ENCOUNTER — Telehealth: Payer: Self-pay

## 2017-12-22 NOTE — Telephone Encounter (Addendum)
Kory RN [facility] called requesting verbal orders for 3 more weeks of visits for this patient, called her back to approve verbal orders.  Also the nurse stated that the patient still has a steri strip in place at a wound site, attempted to [facility] back for more clarification on the wound, no answer, left message to call back.

## 2017-12-23 NOTE — Telephone Encounter (Signed)
Recieved message from doctor swartz.  Went to verify patients last/next visit with the clinic after finding out that the doctor has 2 open slots for appts on Monday.  Found out that patient has NOT been to this clinic yet and has had no hospital follow up to see Dr. Naaman Plummer at this time.  Informed registration staff of this and a hospital follow up will be made for this patient promptly.

## 2017-12-23 NOTE — Telephone Encounter (Signed)
The patient needs to be seen by a provider to help determine what needs to be done.  This can be handled over the phone.  If she wants to see me this week I can take a look at it if I have an opening available.

## 2017-12-23 NOTE — Telephone Encounter (Signed)
Kristine Mosley called back, states the steri strip is on her scalp area. Also that the wound site feels infected and that water gets trapped in the wound itself.  Noted also in patients chart that she was seen for this issue already on 12-14-2017 and treated with keflex antibiotics.  Nurse is wanting to know what patient should do on this issue.

## 2017-12-26 ENCOUNTER — Encounter: Payer: Self-pay | Admitting: Physical Medicine & Rehabilitation

## 2017-12-26 ENCOUNTER — Encounter: Payer: Medicaid Other | Attending: Physical Medicine & Rehabilitation | Admitting: Physical Medicine & Rehabilitation

## 2017-12-26 VITALS — BP 121/87 | HR 110 | Resp 14

## 2017-12-26 DIAGNOSIS — Z8249 Family history of ischemic heart disease and other diseases of the circulatory system: Secondary | ICD-10-CM | POA: Diagnosis not present

## 2017-12-26 DIAGNOSIS — S32401D Unspecified fracture of right acetabulum, subsequent encounter for fracture with routine healing: Secondary | ICD-10-CM | POA: Insufficient documentation

## 2017-12-26 DIAGNOSIS — Z87442 Personal history of urinary calculi: Secondary | ICD-10-CM | POA: Insufficient documentation

## 2017-12-26 DIAGNOSIS — R269 Unspecified abnormalities of gait and mobility: Secondary | ICD-10-CM | POA: Insufficient documentation

## 2017-12-26 DIAGNOSIS — S82141D Displaced bicondylar fracture of right tibia, subsequent encounter for closed fracture with routine healing: Secondary | ICD-10-CM | POA: Diagnosis not present

## 2017-12-26 DIAGNOSIS — Z4801 Encounter for change or removal of surgical wound dressing: Secondary | ICD-10-CM | POA: Insufficient documentation

## 2017-12-26 DIAGNOSIS — K219 Gastro-esophageal reflux disease without esophagitis: Secondary | ICD-10-CM | POA: Insufficient documentation

## 2017-12-26 DIAGNOSIS — Z87891 Personal history of nicotine dependence: Secondary | ICD-10-CM | POA: Insufficient documentation

## 2017-12-26 DIAGNOSIS — S0101XS Laceration without foreign body of scalp, sequela: Secondary | ICD-10-CM | POA: Diagnosis not present

## 2017-12-26 DIAGNOSIS — S22089D Unspecified fracture of T11-T12 vertebra, subsequent encounter for fracture with routine healing: Secondary | ICD-10-CM | POA: Insufficient documentation

## 2017-12-26 DIAGNOSIS — S0101XD Laceration without foreign body of scalp, subsequent encounter: Secondary | ICD-10-CM | POA: Diagnosis not present

## 2017-12-26 DIAGNOSIS — S82831D Other fracture of upper and lower end of right fibula, subsequent encounter for closed fracture with routine healing: Secondary | ICD-10-CM | POA: Diagnosis not present

## 2017-12-26 DIAGNOSIS — S22089S Unspecified fracture of T11-T12 vertebra, sequela: Secondary | ICD-10-CM

## 2017-12-26 DIAGNOSIS — S82401D Unspecified fracture of shaft of right fibula, subsequent encounter for closed fracture with routine healing: Secondary | ICD-10-CM | POA: Diagnosis not present

## 2017-12-26 DIAGNOSIS — S0100XA Unspecified open wound of scalp, initial encounter: Secondary | ICD-10-CM | POA: Insufficient documentation

## 2017-12-26 DIAGNOSIS — S069X0D Unspecified intracranial injury without loss of consciousness, subsequent encounter: Secondary | ICD-10-CM | POA: Diagnosis not present

## 2017-12-26 DIAGNOSIS — X58XXXD Exposure to other specified factors, subsequent encounter: Secondary | ICD-10-CM | POA: Diagnosis not present

## 2017-12-26 MED ORDER — CEPHALEXIN 500 MG PO CAPS: 500.0000 mg | ORAL_CAPSULE | Freq: Four times a day (QID) | ORAL | 0 refills | Status: DC

## 2017-12-26 NOTE — Patient Instructions (Signed)
  Wound care:  Remove the dry dressing daily and gently scrubbed to remove any debris.  You can use some water or some normal saline with the gauze to help clean that area.  Once cleaned up replace the dressing with a folded up 4 x 4 covered with a bigger 4 x 4 which you can take to your hair.  Cover with a Of some sort.
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
## 2018-01-02 ENCOUNTER — Encounter: Payer: Self-pay | Admitting: Registered Nurse

## 2018-01-02 ENCOUNTER — Encounter: Payer: Medicaid Other | Admitting: Registered Nurse

## 2018-01-02 VITALS — BP 133/94 | HR 96

## 2018-01-02 DIAGNOSIS — S32401D Unspecified fracture of right acetabulum, subsequent encounter for fracture with routine healing: Secondary | ICD-10-CM | POA: Diagnosis not present

## 2018-01-02 DIAGNOSIS — S0101XS Laceration without foreign body of scalp, sequela: Secondary | ICD-10-CM

## 2018-01-02 DIAGNOSIS — S22089S Unspecified fracture of T11-T12 vertebra, sequela: Secondary | ICD-10-CM
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
## 2018-01-04 DIAGNOSIS — Z7901 Long term (current) use of anticoagulants: Secondary | ICD-10-CM

## 2018-01-04 DIAGNOSIS — S32501D Unspecified fracture of right pubis, subsequent encounter for fracture with routine healing: Secondary | ICD-10-CM

## 2018-01-04 DIAGNOSIS — D473 Essential (hemorrhagic) thrombocythemia: Secondary | ICD-10-CM

## 2018-01-04 DIAGNOSIS — Z452 Encounter for adjustment and management of vascular access device: Secondary | ICD-10-CM

## 2018-01-04 DIAGNOSIS — S82144D Nondisplaced bicondylar fracture of right tibia, subsequent encounter for closed fracture with routine healing: Secondary | ICD-10-CM | POA: Diagnosis not present

## 2018-01-04 DIAGNOSIS — S062X9D Diffuse traumatic brain injury with loss of consciousness of unspecified duration, subsequent encounter: Secondary | ICD-10-CM

## 2018-01-04 DIAGNOSIS — E876 Hypokalemia: Secondary | ICD-10-CM

## 2018-01-04 DIAGNOSIS — I743 Embolism and thrombosis of arteries of the lower extremities: Secondary | ICD-10-CM

## 2018-01-04 DIAGNOSIS — S32401D Unspecified fracture of right acetabulum, subsequent encounter for fracture with routine healing: Secondary | ICD-10-CM

## 2018-01-04 DIAGNOSIS — S22080D Wedge compression fracture of T11-T12 vertebra, subsequent encounter for fracture with routine healing: Secondary | ICD-10-CM

## 2018-01-04 DIAGNOSIS — L89610 Pressure ulcer of right heel, unstageable: Secondary | ICD-10-CM

## 2018-01-16 ENCOUNTER — Encounter: Payer: Self-pay | Admitting: Physical Therapy

## 2018-01-16 ENCOUNTER — Other Ambulatory Visit: Payer: Self-pay

## 2018-01-16 ENCOUNTER — Ambulatory Visit: Payer: Medicaid Other | Attending: Orthopedic Surgery | Admitting: Physical Therapy

## 2018-01-16 DIAGNOSIS — M25661 Stiffness of right knee, not elsewhere classified: Secondary | ICD-10-CM | POA: Insufficient documentation

## 2018-01-16 DIAGNOSIS — M25561 Pain in right knee: Secondary | ICD-10-CM | POA: Diagnosis present

## 2018-01-16 DIAGNOSIS — R262 Difficulty in walking, not elsewhere classified: Secondary | ICD-10-CM | POA: Insufficient documentation

## 2018-01-16 DIAGNOSIS — M25571 Pain in right ankle and joints of right foot: Secondary | ICD-10-CM | POA: Diagnosis present

## 2018-01-16 DIAGNOSIS — M6281 Muscle weakness (generalized): Secondary | ICD-10-CM | POA: Diagnosis present

## 2018-01-16 DIAGNOSIS — M21371 Foot drop, right foot: Secondary | ICD-10-CM | POA: Diagnosis not present
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
## 2018-01-30 ENCOUNTER — Encounter: Payer: Self-pay | Admitting: Physical Therapy

## 2018-01-30 ENCOUNTER — Ambulatory Visit: Payer: Medicaid Other | Admitting: Physical Therapy

## 2018-01-30 DIAGNOSIS — M25661 Stiffness of right knee, not elsewhere classified: Secondary | ICD-10-CM

## 2018-01-30 DIAGNOSIS — M6281 Muscle weakness (generalized): Secondary | ICD-10-CM

## 2018-01-30 DIAGNOSIS — M21371 Foot drop, right foot: Secondary | ICD-10-CM | POA: Diagnosis not present

## 2018-01-30 DIAGNOSIS — M25561 Pain in right knee: Secondary | ICD-10-CM

## 2018-01-30 DIAGNOSIS — M25571 Pain in right ankle and joints of right foot: Secondary | ICD-10-CM

## 2018-01-30 DIAGNOSIS — R262 Difficulty in walking, not elsewhere classified: Secondary | ICD-10-CM
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
## 2018-02-01 ENCOUNTER — Ambulatory Visit: Payer: Medicaid Other | Admitting: Physical Therapy

## 2018-02-01 ENCOUNTER — Encounter: Payer: Self-pay | Admitting: Physical Therapy

## 2018-02-01 DIAGNOSIS — M21371 Foot drop, right foot: Secondary | ICD-10-CM

## 2018-02-01 DIAGNOSIS — M25561 Pain in right knee: Secondary | ICD-10-CM

## 2018-02-01 DIAGNOSIS — M25571 Pain in right ankle and joints of right foot: Secondary | ICD-10-CM

## 2018-02-01 DIAGNOSIS — M6281 Muscle weakness (generalized): Secondary | ICD-10-CM

## 2018-02-01 DIAGNOSIS — R262 Difficulty in walking, not elsewhere classified: Secondary | ICD-10-CM

## 2018-02-01 DIAGNOSIS — M25661 Stiffness of right knee, not elsewhere classified: Secondary | ICD-10-CM

## 2018-02-01 NOTE — Therapy (Signed)
[name], [place], [address] Phone: [phone]   Fax:  [phone]  Physical Therapy Treatment  Patient Details  Name: Kristine Mosley MRN: [id number] Date of Birth: 04-08-1986 Referring Provider: Altamese [place], MD   Encounter Date: 02/01/2018  PT End of Session - 02/01/18 1026    Visit Number  3    Number of Visits  13    Authorization Type  Medicaid submit for authurization    PT Start Time  0930    PT Stop Time  1015    PT Time Calculation (min)  45 min    Activity Tolerance  Patient tolerated treatment well    Behavior During Therapy  [facility] for tasks assessed/performed       Past Medical History:  Diagnosis Date  . Anxiety    no meds currently  . Anxiety   . Depression    no meds currently  . Depression   . GERD (gastroesophageal reflux disease)   . Headache    otc med prn  . History of kidney stones   . Insomnia   . SVD (spontaneous vaginal delivery)    x 2    Past Surgical History:  Procedure Laterality Date  . AORTOGRAM Right 11/13/2017   Procedure: AORTOGRAM WITH LOWER EXTREMITY RUN OFF;  Surgeon: Elam Dutch, MD;  Location: [place];  Service: Vascular;  Laterality: Right;  . LAPAROSCOPIC TUBAL LIGATION Bilateral 07/26/2016   Procedure: LAPAROSCOPIC TUBAL LIGATION;  Surgeon: Lavonia Drafts, MD;  Location: [facility];  Service: Gynecology;  Laterality: Bilateral;  . ORIF TIBIA PLATEAU Right 11/17/2017   Procedure: OPEN REDUCTION INTERNAL FIXATION (ORIF) TIBIAL PLATEAU;  Surgeon: Altamese [place], MD;  Location: [place];  Service: Orthopedics;  Laterality: Right;  . TIBIA IM NAIL INSERTION Right 11/17/2017   Procedure: INTRAMEDULLARY (IM) NAIL TIBIAL;  Surgeon: Altamese [place], MD;  Location: [place];  Service: Orthopedics;  Laterality: Right;  . TUBAL LIGATION  07/2016  . WISDOM TOOTH EXTRACTION      There were no vitals filed for this visit.  Subjective Assessment -  02/01/18 1013    Subjective  Pt reporting 2/10 pain and complaining of pain across her midfoot due to AFO discomfort.          [facility] PT Assessment - 02/01/18 0001      AROM   AROM Assessment Site  Knee    Right/Left Knee  Right    Right Knee Extension  10      PROM   PROM Assessment Site  Ankle    Right/Left Ankle  Right    Right Ankle Dorsiflexion  0    Right Ankle Plantar Flexion  18    Right Ankle Inversion  12    Right Ankle Eversion  22                   [facility] Adult PT Treatment/Exercise - 02/01/18 0001      Ambulation/Gait   Assistive device  Crutches;Other (Comment) single crutch    Gait Comments  Pt amb with R AFO with increased R knee flexion, pt amb 2 laps with axillary crutch and with no device with incresed antalgic gait with no device.       Exercises   Exercises  Knee/Hip;Ankle      Knee/Hip Exercises: Stretches   Active Hamstring Stretch  3 reps;30 seconds    Passive Hamstring Stretch  Right;3 reps;30 seconds    Press photographer  3 reps;30 seconds    Other Knee/Hip Stretches  Achilles tendon stretch, x 3 holding 30 seconds      Knee/Hip Exercises: Prone   Hamstring Curl  2 sets;15 reps    Hamstring Curl Limitations  keeping hips in neutral position    Hip Extension  Strengthening;Right;2 sets;15 reps    Prone Knee Hang  Limitations    Prone Knee Hang Weights (lbs)  10 minutes, while STW was performed      Manual Therapy   Manual Therapy  Soft tissue mobilization;Passive ROM    Manual therapy comments  using Biofreeze x 15 minutes, pt in supine for 5 minutes, pt in prone for 10 minutes    Soft tissue mobilization  Plantar surface of right foot, calf, heel cord    Passive ROM  DF, PF, Inversion, Eversion, Gastroc and hamstring stretch               PT Short Term Goals - 01/30/18 0956      PT SHORT TERM GOAL #1   Title  Pt to receive her right anterior AFO and independently donn/doff.     Baseline  currently waiting insurance  approval, pt fitted today    Period  Weeks    Status  New        PT Long Term Goals - 02/01/18 1024      PT LONG TERM GOAL #1   Title  Pt will be able to walk using her R AFO with no assistive device with step through gait pattern for >/= 1000 feet on community level surfaces.     Baseline  pt amb with R AFO with single axillary crutch    Time  6    Period  Weeks    Status  On-going      PT LONG TERM GOAL #2   Title  Pt will be able to climb >/= 5 steps with R AFO and no assistive device in order to get in and out of her home safely using single hand rail.     Baseline  pt using single hand rail, axillary crutch and AFO    Time  6    Period  Weeks    Status  On-going            Plan - 02/01/18 1026    Clinical Impression Statement  Pt arriving to therapy today reporting 2/10 R foot pain. Pt received her AFO yesterday and reported mild pain over midfoot with reddness noted. Pt was instructed in AFO gradual wearing schedule progressing over the next 2 weeks to full day wear. Pt also instructed to monitor her reddness each time she takes off her brace. Pt also took a picture of her foot with her phone  to compare. Pt tolerating STW and manual stretching/PROM to her R ankle, heel cord and hanmstrings. Continue skilled PT.     Rehab Potential  Excellent    Clinical Impairments Affecting Rehab Potential  received AFO yesterday    PT Frequency  2x / week    PT Duration  6 weeks    PT Next Visit Plan  R LE strengthening exercises, advance pt's HEP, concentration on gait and knee extension.     PT Home Exercise Plan  hamstring/df stretch, prone knee hangs, quad sets with overpressure    Consulted and Agree with Plan of Care  Patient       Patient will benefit from skilled therapeutic intervention in order to improve the following  deficits and impairments:  Abnormal gait, Pain, Decreased activity tolerance, Decreased range of motion, Postural dysfunction, Decreased mobility, Decreased  balance, Difficulty walking, Decreased strength  Visit Diagnosis: Foot drop, right  Pain in right ankle and joints of right foot  Acute pain of right knee  Stiffness of right knee, not elsewhere classified  Difficulty in walking, not elsewhere classified  Muscle weakness (generalized)     Problem List Patient Active Problem List   Diagnosis Date Noted  . Open wound of scalp 12/26/2017  . Thrombocytosis ([place])   . Leukocytosis   . Cellulitis of right lower extremity   . Postoperative pain   . 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 Closed T12 fracture ([place]) 11/24/2017  . 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 Trauma 11/24/2017  . Fracture   . ETOH abuse   . Tobacco abuse   . Pain   . Hypokalemia   . Acute blood loss anemia   . Pelvic fracture ([place]) 11/12/2017  . Closed fracture of right tibial plateau   . Closed fracture of shaft of right tibia and fibula   . Sterilization consult 07/26/2016  . Encounter for sterilization 07/26/2016    Oretha Caprice, MPT 02/01/2018, 10:40 AM  [place], [place], [address] Phone: [phone]   Fax:  [phone]  Name: Melaine Mcphee MRN: [id number] Date of Birth: 1986-04-13

## 2018-02-07 ENCOUNTER — Ambulatory Visit: Payer: Medicaid Other | Admitting: Physical Therapy

## 2018-02-07 ENCOUNTER — Encounter: Payer: Self-pay | Admitting: Physical Therapy

## 2018-02-07 DIAGNOSIS — M21371 Foot drop, right foot: Secondary | ICD-10-CM | POA: Diagnosis not present

## 2018-02-07 DIAGNOSIS — M25661 Stiffness of right knee, not elsewhere classified: Secondary | ICD-10-CM

## 2018-02-07 DIAGNOSIS — M25571 Pain in right ankle and joints of right foot: Secondary | ICD-10-CM

## 2018-02-07 DIAGNOSIS — M6281 Muscle weakness (generalized): Secondary | ICD-10-CM

## 2018-02-07 DIAGNOSIS — M25561 Pain in right knee: Secondary | ICD-10-CM

## 2018-02-07 DIAGNOSIS — R262 Difficulty in walking, not elsewhere classified: Secondary | ICD-10-CM
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
## 2018-02-08 ENCOUNTER — Encounter: Payer: Medicaid Other | Attending: Physical Medicine & Rehabilitation | Admitting: Physical Medicine & Rehabilitation

## 2018-02-08 ENCOUNTER — Encounter: Payer: Self-pay | Admitting: Physical Medicine & Rehabilitation

## 2018-02-08 VITALS — BP 123/93 | HR 109 | Ht 71.0 in | Wt 125.0 lb

## 2018-02-08 DIAGNOSIS — K219 Gastro-esophageal reflux disease without esophagitis: Secondary | ICD-10-CM | POA: Insufficient documentation

## 2018-02-08 DIAGNOSIS — S0101XD Laceration without foreign body of scalp, subsequent encounter: Secondary | ICD-10-CM | POA: Diagnosis not present

## 2018-02-08 DIAGNOSIS — Z4801 Encounter for change or removal of surgical wound dressing: Secondary | ICD-10-CM | POA: Diagnosis not present

## 2018-02-08 DIAGNOSIS — S069X0D Unspecified intracranial injury without loss of consciousness, subsequent encounter: Secondary | ICD-10-CM | POA: Diagnosis not present

## 2018-02-08 DIAGNOSIS — S0101XS Laceration without foreign body of scalp, sequela: Secondary | ICD-10-CM | POA: Diagnosis not present

## 2018-02-08 DIAGNOSIS — S82141D Displaced bicondylar fracture of right tibia, subsequent encounter for closed fracture with routine healing: Secondary | ICD-10-CM | POA: Insufficient documentation

## 2018-02-08 DIAGNOSIS — Z8249 Family history of ischemic heart disease and other diseases of the circulatory system: Secondary | ICD-10-CM | POA: Diagnosis not present

## 2018-02-08 DIAGNOSIS — Z87442 Personal history of urinary calculi: Secondary | ICD-10-CM | POA: Insufficient documentation

## 2018-02-08 DIAGNOSIS — S22089D Unspecified fracture of T11-T12 vertebra, subsequent encounter for fracture with routine healing: Secondary | ICD-10-CM | POA: Insufficient documentation

## 2018-02-08 DIAGNOSIS — S82831D Other fracture of upper and lower end of right fibula, subsequent encounter for closed fracture with routine healing: Secondary | ICD-10-CM | POA: Insufficient documentation

## 2018-02-08 DIAGNOSIS — R269 Unspecified abnormalities of gait and mobility: Secondary | ICD-10-CM | POA: Diagnosis not present

## 2018-02-08 DIAGNOSIS — S32401D Unspecified fracture of right acetabulum, subsequent encounter for fracture with routine healing: Secondary | ICD-10-CM | POA: Diagnosis not present

## 2018-02-08 DIAGNOSIS — S82401D Unspecified fracture of shaft of right fibula, subsequent encounter for closed fracture with routine healing: Secondary | ICD-10-CM | POA: Insufficient documentation

## 2018-02-08 DIAGNOSIS — Z87891 Personal history of nicotine dependence: Secondary | ICD-10-CM | POA: Diagnosis not present

## 2018-02-08 DIAGNOSIS — S22089S Unspecified fracture of T11-T12 vertebra, sequela: Secondary | ICD-10-CM
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
## 2018-02-08 NOTE — Patient Instructions (Signed)
PLEASE FEEL FREE TO CALL OUR OFFICE WITH ANY PROBLEMS OR QUESTIONS ([phone])     CONTINUE TO WORK ON YOUR KNEE RANGE OF MOTION AND WALKING MECHANICS   YOU CAN PROBABLY JUST TAKE A MULTIVITAMIN WITH B COMPLEX.

## 2018-02-09 ENCOUNTER — Encounter: Payer: Self-pay | Admitting: Physical Therapy

## 2018-02-09 ENCOUNTER — Ambulatory Visit: Payer: Medicaid Other | Admitting: Physical Therapy

## 2018-02-09 DIAGNOSIS — M21371 Foot drop, right foot: Secondary | ICD-10-CM

## 2018-02-09 DIAGNOSIS — R262 Difficulty in walking, not elsewhere classified: Secondary | ICD-10-CM

## 2018-02-09 DIAGNOSIS — M25661 Stiffness of right knee, not elsewhere classified: Secondary | ICD-10-CM

## 2018-02-09 DIAGNOSIS — M6281 Muscle weakness (generalized): Secondary | ICD-10-CM

## 2018-02-09 DIAGNOSIS — M25561 Pain in right knee: Secondary | ICD-10-CM

## 2018-02-09 DIAGNOSIS — M25571 Pain in right ankle and joints of right foot: Secondary | ICD-10-CM
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
## 2018-02-13 ENCOUNTER — Encounter: Payer: Medicaid Other | Admitting: Physical Therapy

## 2018-02-14 ENCOUNTER — Ambulatory Visit: Payer: Medicaid Other | Admitting: Physical Therapy

## 2018-02-14 ENCOUNTER — Encounter: Payer: Self-pay | Admitting: Physical Therapy

## 2018-02-14 DIAGNOSIS — M25561 Pain in right knee: Secondary | ICD-10-CM

## 2018-02-14 DIAGNOSIS — M6281 Muscle weakness (generalized): Secondary | ICD-10-CM

## 2018-02-14 DIAGNOSIS — M21371 Foot drop, right foot: Secondary | ICD-10-CM | POA: Diagnosis not present

## 2018-02-14 DIAGNOSIS — M25661 Stiffness of right knee, not elsewhere classified: Secondary | ICD-10-CM

## 2018-02-14 DIAGNOSIS — M25571 Pain in right ankle and joints of right foot: Secondary | ICD-10-CM
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
## 2018-02-15 ENCOUNTER — Encounter: Payer: Medicaid Other | Admitting: Physical Therapy

## 2018-02-16 ENCOUNTER — Ambulatory Visit: Payer: Medicaid Other | Attending: Orthopedic Surgery | Admitting: Physical Therapy

## 2018-02-16 ENCOUNTER — Encounter: Payer: Self-pay | Admitting: Physical Therapy

## 2018-02-16 DIAGNOSIS — M25561 Pain in right knee: Secondary | ICD-10-CM

## 2018-02-16 DIAGNOSIS — R262 Difficulty in walking, not elsewhere classified: Secondary | ICD-10-CM | POA: Diagnosis present

## 2018-02-16 DIAGNOSIS — M6281 Muscle weakness (generalized): Secondary | ICD-10-CM | POA: Diagnosis present

## 2018-02-16 DIAGNOSIS — M21371 Foot drop, right foot: Secondary | ICD-10-CM

## 2018-02-16 DIAGNOSIS — M25571 Pain in right ankle and joints of right foot: Secondary | ICD-10-CM | POA: Diagnosis present

## 2018-02-16 DIAGNOSIS — M25661 Stiffness of right knee, not elsewhere classified: Secondary | ICD-10-CM
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
## 2018-02-20 ENCOUNTER — Ambulatory Visit: Payer: Medicaid Other | Admitting: Physical Therapy

## 2018-02-20 DIAGNOSIS — M25571 Pain in right ankle and joints of right foot: Secondary | ICD-10-CM | POA: Diagnosis not present

## 2018-02-20 DIAGNOSIS — R262 Difficulty in walking, not elsewhere classified: Secondary | ICD-10-CM

## 2018-02-20 DIAGNOSIS — M6281 Muscle weakness (generalized): Secondary | ICD-10-CM

## 2018-02-20 DIAGNOSIS — M25561 Pain in right knee: Secondary | ICD-10-CM

## 2018-02-20 DIAGNOSIS — M25661 Stiffness of right knee, not elsewhere classified: Secondary | ICD-10-CM

## 2018-02-20 DIAGNOSIS — M21371 Foot drop, right foot: Secondary | ICD-10-CM

## 2018-02-20 NOTE — Therapy (Signed)
[place], [place], [address] Phone: [phone]   Fax:  [phone]  Physical Therapy Treatment  Patient Details  Name: Kristine Mosley MRN: [id number] Date of Birth: Sep 12, 1986 Referring Provider: Altamese [place], MD   Encounter Date: 02/20/2018  PT End of Session - 02/20/18 1017    Visit Number  8    Number of Visits  13    Authorization Type  Medicaid submit for authurization    PT Start Time  0930    PT Stop Time  1025    PT Time Calculation (min)  55 min    Activity Tolerance  Patient tolerated treatment well    Behavior During Therapy  [facility] for tasks assessed/performed       Past Medical History:  Diagnosis Date  . Anxiety    no meds currently  . Anxiety   . Depression    no meds currently  . Depression   . GERD (gastroesophageal reflux disease)   . Headache    otc med prn  . History of kidney stones   . Insomnia   . SVD (spontaneous vaginal delivery)    x 2    Past Surgical History:  Procedure Laterality Date  . AORTOGRAM Right 11/13/2017   Procedure: AORTOGRAM WITH LOWER EXTREMITY RUN OFF;  Surgeon: Elam Dutch, MD;  Location: [place];  Service: Vascular;  Laterality: Right;  . LAPAROSCOPIC TUBAL LIGATION Bilateral 07/26/2016   Procedure: LAPAROSCOPIC TUBAL LIGATION;  Surgeon: Lavonia Drafts, MD;  Location: [facility];  Service: Gynecology;  Laterality: Bilateral;  . ORIF TIBIA PLATEAU Right 11/17/2017   Procedure: OPEN REDUCTION INTERNAL FIXATION (ORIF) TIBIAL PLATEAU;  Surgeon: Altamese Dresser, MD;  Location: [place];  Service: Orthopedics;  Laterality: Right;  . TIBIA IM NAIL INSERTION Right 11/17/2017   Procedure: INTRAMEDULLARY (IM) NAIL TIBIAL;  Surgeon: Altamese Livingston, MD;  Location: [place];  Service: Orthopedics;  Laterality: Right;  . TUBAL LIGATION  07/2016  . WISDOM TOOTH EXTRACTION      There were no vitals filed for this  visit.                    [facility] Adult PT Treatment/Exercise - 02/20/18 0001      Knee/Hip Exercises: Stretches   Active Hamstring Stretch  3 reps;30 seconds    Gastroc Stretch  3 reps;30 seconds      Knee/Hip Exercises: Standing   Knee Flexion  Strengthening;2 sets;10 reps    Hip Flexion  Stengthening;2 sets;10 reps    Hip Abduction  Stengthening;2 sets;10 reps    Hip Extension  Stengthening;2 sets;10 reps    Lateral Step Up  2 sets;15 reps;Step Height: 4"    Forward Step Up  2 sets;15 reps;Step Height: 4"    Functional Squat  3 sets;10 reps      Knee/Hip Exercises: Seated   Long Arc Quad  2 sets;10 reps      Modalities   Modalities  Cryotherapy      Cryotherapy   Number Minutes Cryotherapy  8 Minutes    Cryotherapy Location  Knee    Type of Cryotherapy  Ice pack      Manual Therapy   Manual Therapy  Soft tissue mobilization;Passive ROM    Manual therapy comments  Patient allergic to coco butter    Soft tissue mobilization  posterior hamstring relief     Passive ROM  knee extnesion stretching       Ankle  Exercises: Supine   T-Band  ankle DF, PF, Inv, and Eversion,  2x10 green               PT Short Term Goals - 02/14/18 1126      PT SHORT TERM GOAL #1   Title  Pt to receive her right anterior AFO and independently donn/doff.     Baseline  patient able to independently donn/doff.    Time  3    Period  Weeks    Status  Achieved        PT Long Term Goals - 02/20/18 1022      PT LONG TERM GOAL #1   Title  Pt will be able to walk using her R AFO with no assistive device with step through gait pattern for >/= 1000 feet on community level surfaces.     Baseline  Pt amb with AFO on R LE with mild limp and decreased weight shifting to the R.     Time  6    Status  On-going      PT LONG TERM GOAL #2   Title  Pt will be able to climb >/= 5 steps with R AFO and no assistive device in order to get in and out of her home safely using single hand  rail.     Baseline  pt using single handrail,  AFO    Time  6    Period  Weeks    Status  On-going            Plan - 02/20/18 1019    Clinical Impression Statement  Pt tolerating therapy well. Pt with no pain reported. Still progressing toward R LE strengthening and knee extension ROM. AROM today was 8 degrees, PROM R knee extension was 6 degrees. Pt still presenting with mild limp on R LE with decreased weight shifting to R. Discussed continued sessions to progress toward pt's PLOF.     Rehab Potential  Good    Clinical Impairments Affecting Rehab Potential  received AFO    PT Frequency  2x / week    PT Duration  6 weeks    PT Treatment/Interventions  ADLs/Self Care Home Management;Gait training;Stair training;Functional mobility training;Therapeutic activities;Therapeutic exercise;Manual techniques;Orthotic Fit/Training;Patient/family education;Balance training;Neuromuscular re-education;Scar mobilization;Passive range of motion;Taping;Vasopneumatic Device;Cryotherapy    PT Next Visit Plan  R LE strengthening exercises, advance pt's HEP, concentration on gait and knee extension.     PT Home Exercise Plan  hamstring/df stretch, prone knee hangs, quad sets with overpressure    Consulted and Agree with Plan of Care  Patient       Patient will benefit from skilled therapeutic intervention in order to improve the following deficits and impairments:  Abnormal gait, Pain, Decreased activity tolerance, Decreased range of motion, Postural dysfunction, Decreased mobility, Decreased balance, Difficulty walking, Decreased strength  Visit Diagnosis: Pain in right ankle and joints of right foot  Foot drop, right  Acute pain of right knee  Muscle weakness (generalized)  Stiffness of right knee, not elsewhere classified  Difficulty in walking, not elsewhere classified     Problem List Patient Active Problem List   Diagnosis Date Noted  . Open wound of scalp 12/26/2017  .  Thrombocytosis ([place])   . Leukocytosis   . Cellulitis of right lower extremity   . Postoperative pain   . 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 Closed T12 fracture ([place]) 11/24/2017  . 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 Trauma 11/24/2017  .  Fracture   . ETOH abuse   . Tobacco abuse   . Pain   . Hypokalemia   . Acute blood loss anemia   . Pelvic fracture ([place]) 11/12/2017  . Closed fracture of right tibial plateau   . Closed fracture of shaft of right tibia and fibula   . Sterilization consult 07/26/2016  . Encounter for sterilization 07/26/2016    Oretha Caprice, MPT 02/20/2018, 10:34 AM  [place], [place], [address] Phone: [phone]   Fax:  [phone]  Name: Jakki Doughty MRN: [id number] Date of Birth: August 19, 1986

## 2018-02-22 ENCOUNTER — Encounter: Payer: Self-pay | Admitting: Physical Therapy

## 2018-02-22 ENCOUNTER — Ambulatory Visit: Payer: Medicaid Other | Admitting: Physical Therapy

## 2018-02-22 DIAGNOSIS — R262 Difficulty in walking, not elsewhere classified: Secondary | ICD-10-CM

## 2018-02-22 DIAGNOSIS — M25561 Pain in right knee: Secondary | ICD-10-CM

## 2018-02-22 DIAGNOSIS — M25661 Stiffness of right knee, not elsewhere classified: Secondary | ICD-10-CM

## 2018-02-22 DIAGNOSIS — M21371 Foot drop, right foot: Secondary | ICD-10-CM

## 2018-02-22 DIAGNOSIS — M6281 Muscle weakness (generalized): Secondary | ICD-10-CM

## 2018-02-22 DIAGNOSIS — M25571 Pain in right ankle and joints of right foot: Secondary | ICD-10-CM
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
## 2018-02-28 ENCOUNTER — Ambulatory Visit: Payer: Medicaid Other | Admitting: Physical Therapy

## 2018-02-28 ENCOUNTER — Encounter: Payer: Self-pay | Admitting: Physical Therapy

## 2018-02-28 DIAGNOSIS — M6281 Muscle weakness (generalized): Secondary | ICD-10-CM

## 2018-02-28 DIAGNOSIS — M21371 Foot drop, right foot: Secondary | ICD-10-CM

## 2018-02-28 DIAGNOSIS — M25571 Pain in right ankle and joints of right foot: Secondary | ICD-10-CM | POA: Diagnosis not present

## 2018-02-28 DIAGNOSIS — M25561 Pain in right knee: Secondary | ICD-10-CM

## 2018-02-28 DIAGNOSIS — M25661 Stiffness of right knee, not elsewhere classified: Secondary | ICD-10-CM

## 2018-02-28 DIAGNOSIS — R262 Difficulty in walking, not elsewhere classified: Secondary | ICD-10-CM
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
## 2018-03-01 ENCOUNTER — Ambulatory Visit: Payer: Medicaid Other | Admitting: Physical Therapy

## 2018-03-01 ENCOUNTER — Encounter: Payer: Self-pay | Admitting: Physical Therapy

## 2018-03-01 DIAGNOSIS — M21371 Foot drop, right foot: Secondary | ICD-10-CM

## 2018-03-01 DIAGNOSIS — M25571 Pain in right ankle and joints of right foot: Secondary | ICD-10-CM | POA: Diagnosis not present

## 2018-03-01 DIAGNOSIS — M25561 Pain in right knee: Secondary | ICD-10-CM

## 2018-03-01 DIAGNOSIS — M6281 Muscle weakness (generalized): Secondary | ICD-10-CM

## 2018-03-01 DIAGNOSIS — R262 Difficulty in walking, not elsewhere classified: Secondary | ICD-10-CM

## 2018-03-01 DIAGNOSIS — M25661 Stiffness of right knee, not elsewhere classified: Secondary | ICD-10-CM
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
## 2018-03-08 ENCOUNTER — Ambulatory Visit: Payer: Medicaid Other | Admitting: Physical Therapy

## 2018-03-08 ENCOUNTER — Encounter: Payer: Self-pay | Admitting: Physical Therapy

## 2018-03-08 DIAGNOSIS — R262 Difficulty in walking, not elsewhere classified: Secondary | ICD-10-CM

## 2018-03-08 DIAGNOSIS — M21371 Foot drop, right foot: Secondary | ICD-10-CM

## 2018-03-08 DIAGNOSIS — M25661 Stiffness of right knee, not elsewhere classified: Secondary | ICD-10-CM

## 2018-03-08 DIAGNOSIS — M25571 Pain in right ankle and joints of right foot: Secondary | ICD-10-CM | POA: Diagnosis not present

## 2018-03-08 DIAGNOSIS — M6281 Muscle weakness (generalized): Secondary | ICD-10-CM

## 2018-03-08 DIAGNOSIS — M25561 Pain in right knee: Secondary | ICD-10-CM

## 2018-03-08 NOTE — Therapy (Signed)
[place], [place], [address] Phone: [phone]   Fax:  [phone]  Physical Therapy Treatment/Discharge   Patient Details  Name: Kristine Mosley MRN: [id number] Date of Birth: 24-May-1986 Referring Provider: Altamese [place], MD   Encounter Date: 03/08/2018  PT End of Session - 03/08/18 1000    Visit Number  12    Number of Visits  13    Date for PT Re-Evaluation  03/12/18    PT Start Time  0931    PT Stop Time  1013    PT Time Calculation (min)  42 min    Activity Tolerance  Patient tolerated treatment well    Behavior During Therapy  [facility] for tasks assessed/performed       Past Medical History:  Diagnosis Date  . Anxiety    no meds currently  . Anxiety   . Depression    no meds currently  . Depression   . GERD (gastroesophageal reflux disease)   . Headache    otc med prn  . History of kidney stones   . Insomnia   . SVD (spontaneous vaginal delivery)    x 2    Past Surgical History:  Procedure Laterality Date  . AORTOGRAM Right 11/13/2017   Procedure: AORTOGRAM WITH LOWER EXTREMITY RUN OFF;  Surgeon: Elam Dutch, MD;  Location: [place];  Service: Vascular;  Laterality: Right;  . LAPAROSCOPIC TUBAL LIGATION Bilateral 07/26/2016   Procedure: LAPAROSCOPIC TUBAL LIGATION;  Surgeon: Lavonia Drafts, MD;  Location: [facility];  Service: Gynecology;  Laterality: Bilateral;  . ORIF TIBIA PLATEAU Right 11/17/2017   Procedure: OPEN REDUCTION INTERNAL FIXATION (ORIF) TIBIAL PLATEAU;  Surgeon: Altamese [place], MD;  Location: [place];  Service: Orthopedics;  Laterality: Right;  . TIBIA IM NAIL INSERTION Right 11/17/2017   Procedure: INTRAMEDULLARY (IM) NAIL TIBIAL;  Surgeon: Altamese [place], MD;  Location: [place];  Service: Orthopedics;  Laterality: Right;  . TUBAL LIGATION  07/2016  . WISDOM TOOTH EXTRACTION      There were no vitals filed for this visit.  Subjective Assessment - 03/08/18 1212     Subjective  No pain today.  This is my last visit.  I'm ready as I'll ever be.  Wears brace but not all the time.      Currently in Pain?  No/denies         [facility] PT Assessment - 03/08/18 0001      AROM   Right Knee Extension  6    Right Knee Flexion  155      PROM   Right Ankle Dorsiflexion  4    Right Ankle Plantar Flexion  20    Right Ankle Inversion  25    Right Ankle Eversion  20      Strength   Overall Strength Comments  ankle DF 3-/5, PF 4/5, Inv 3+/5, Ev 3+/5 , knee ext 4/5 pain, knee flexion 4+/5          [facility] Adult PT Treatment/Exercise - 03/08/18 0001      Knee/Hip Exercises: Stretches   Active Hamstring Stretch  3 reps;30 seconds    Gastroc Stretch  3 reps;30 seconds    Gastroc Stretch Limitations  multiple ways       Knee/Hip Exercises: Aerobic   Stationary Bike  6 min L 2       Knee/Hip Exercises: Standing   Heel Raises  1 set;10 reps    Forward Step Up  Right;2  sets;10 reps;Hand Hold: 1;Step Height: 8"      Ankle Exercises: Supine   T-Band  ankle DF, PF, Inv, and Eversion,  2x10 green gave band for home       Ankle Exercises: Stretches   Other Stretch  stretch off step 2 min              PT Education - 03/08/18 1225    Education provided  Yes    Education Details  wearing AFO , HEP    Person(s) Educated  Patient    Methods  Explanation    Comprehension  Verbalized understanding       PT Short Term Goals - 03/08/18 0934      PT SHORT TERM GOAL #1   Title  Pt to receive her right anterior AFO and independently donn/doff.     Status  Achieved        PT Long Term Goals - 03/08/18 0934      PT LONG TERM GOAL #1   Title  Pt will be able to walk using her R AFO with no assistive device with step through gait pattern for >/= 1000 feet on community level surfaces.     Baseline  Pt amb with AFO on R LE with mild limp and decreased weight shifting to the R.     Status  Achieved      PT LONG TERM GOAL #2   Title  Pt will be able to  climb >/= 5 steps with R AFO and no assistive device in order to get in and out of her home safely using single hand rail.     Baseline  pt using single handrail,  AFO    Status  Achieved            Plan - 03/08/18 0935    Clinical Impression Statement  Pt is independent with all HEP and does it 3 times per day.  She is able to walk short distances without AFO and min to no limp.  She cont to lack full knee extension..  She uses ice intermittently.     PT Treatment/Interventions  ADLs/Self Care Home Management;Gait training;Stair training;Functional mobility training;Therapeutic activities;Therapeutic exercise;Manual techniques;Orthotic Fit/Training;Patient/family education;Balance training;Neuromuscular re-education;Scar mobilization;Passive range of motion;Taping;Vasopneumatic Device;Cryotherapy    PT Next Visit [place]  hamstring/df stretch, prone knee hangs, quad sets with overpressure, ankle theraband red    Consulted and Agree with Plan of Care  Patient       Patient will benefit from skilled therapeutic intervention in order to improve the following deficits and impairments:  Abnormal gait, Pain, Decreased activity tolerance, Decreased range of motion, Postural dysfunction, Decreased mobility, Decreased balance, Difficulty walking, Decreased strength  Visit Diagnosis: Pain in right ankle and joints of right foot  Foot drop, right  Acute pain of right knee  Muscle weakness (generalized)  Stiffness of right knee, not elsewhere classified  Difficulty in walking, not elsewhere classified     Problem List Patient Active Problem List   Diagnosis Date Noted  . Open wound of scalp 12/26/2017  . Thrombocytosis ([place])   . Leukocytosis   . Cellulitis of right lower extremity   . Postoperative pain   . 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 Closed T12 fracture ([place]) 11/24/2017  . 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 Trauma 11/24/2017  . Fracture   .  ETOH abuse   . Tobacco abuse   . Pain   .  Hypokalemia   . Acute blood loss anemia   . Pelvic fracture ([place]) 11/12/2017  . Closed fracture of right tibial plateau   . Closed fracture of shaft of right tibia and fibula   . Sterilization consult 07/26/2016  . Encounter for sterilization 07/26/2016    Ximenna Fonseca 03/08/2018, 12:26 PM  [place], [place], [address] Phone: [phone]   Fax:  [phone]  Name: Fumiye Lubben MRN: [id number] Date of Birth: 10/08/1986  PHYSICAL THERAPY DISCHARGE SUMMARY  Visits from Start of Care: 12  Current functional level related to goals / functional outcomes: See above    Remaining deficits: Knee ROM ankle ROM and strength    Education / Equipment: HEP, gait, AFO  Plan: Patient agrees to discharge.  Patient goals were met. Patient is being discharged due to meeting the stated rehab goals.  ?????     Raeford Razor, PT 03/08/18 12:27 PM Phone: [phone] Fax: [phone]

## 2018-03-10 ENCOUNTER — Encounter

## 2018-06-12 ENCOUNTER — Encounter: Payer: Self-pay | Admitting: Physical Medicine & Rehabilitation

## 2018-06-12 ENCOUNTER — Encounter: Payer: Medicaid Other | Attending: Physical Medicine & Rehabilitation | Admitting: Physical Medicine & Rehabilitation

## 2018-06-12 VITALS — BP 145/95 | HR 95 | Resp 14 | Ht 69.0 in | Wt 129.0 lb

## 2018-06-12 DIAGNOSIS — F419 Anxiety disorder, unspecified: Secondary | ICD-10-CM | POA: Diagnosis not present

## 2018-06-12 DIAGNOSIS — Z8249 Family history of ischemic heart disease and other diseases of the circulatory system: Secondary | ICD-10-CM | POA: Diagnosis not present

## 2018-06-12 DIAGNOSIS — X58XXXS Exposure to other specified factors, sequela: Secondary | ICD-10-CM | POA: Diagnosis not present

## 2018-06-12 DIAGNOSIS — S22089S Unspecified fracture of T11-T12 vertebra, sequela: Secondary | ICD-10-CM | POA: Insufficient documentation

## 2018-06-12 DIAGNOSIS — G4700 Insomnia, unspecified: Secondary | ICD-10-CM | POA: Diagnosis not present

## 2018-06-12 DIAGNOSIS — R269 Unspecified abnormalities of gait and mobility: Secondary | ICD-10-CM | POA: Diagnosis present

## 2018-06-12 DIAGNOSIS — S82401S Unspecified fracture of shaft of right fibula, sequela: Secondary | ICD-10-CM | POA: Insufficient documentation

## 2018-06-12 DIAGNOSIS — M549 Dorsalgia, unspecified: Secondary | ICD-10-CM | POA: Insufficient documentation

## 2018-06-12 DIAGNOSIS — M546 Pain in thoracic spine: Secondary | ICD-10-CM | POA: Diagnosis not present

## 2018-06-12 DIAGNOSIS — S82201S Unspecified fracture of shaft of right tibia, sequela: Secondary | ICD-10-CM | POA: Insufficient documentation

## 2018-06-12 DIAGNOSIS — Z87442 Personal history of urinary calculi: Secondary | ICD-10-CM | POA: Diagnosis not present

## 2018-06-12 DIAGNOSIS — S069X0S Unspecified intracranial injury without loss of consciousness, sequela: Secondary | ICD-10-CM | POA: Diagnosis not present

## 2018-06-12 DIAGNOSIS — S8411XS Injury of peroneal nerve at lower leg level, right leg, sequela: Secondary | ICD-10-CM | POA: Diagnosis not present

## 2018-06-12 DIAGNOSIS — S32401S Unspecified fracture of right acetabulum, sequela: Secondary | ICD-10-CM | POA: Insufficient documentation

## 2018-06-12 DIAGNOSIS — F329 Major depressive disorder, single episode, unspecified: Secondary | ICD-10-CM | POA: Insufficient documentation

## 2018-06-12 DIAGNOSIS — K219 Gastro-esophageal reflux disease without esophagitis: Secondary | ICD-10-CM | POA: Diagnosis not present

## 2018-06-12 DIAGNOSIS — Z87891 Personal history of nicotine dependence: Secondary | ICD-10-CM | POA: Insufficient documentation

## 2018-06-12 MED ORDER — METHOCARBAMOL 500 MG PO TABS: 500.0000 mg | ORAL_TABLET | Freq: Four times a day (QID) | ORAL | 1 refills | Status: AC | PRN

## 2018-06-12 NOTE — Progress Notes (Signed)
Subjective:    Patient ID: Kristine Mosley, female    DOB: [birth year], 32 y.o.   MRN: [id number]  Kristine Mosley is here in follow up of her TBI. She fell in June on her back and has had increased spasms and pain since then. She has put some heat on the area but hasn't used anything else. She has surgery planned to remove hardware from her right ankle in a couple weeks.   Otherwise, she's doing well. She is independent at a household level.      Pain Inventory Average Pain 5 Pain Right Now 4 My pain is constant and sharp  In the last 24 hours, has pain interfered with the following? General activity 6 Relation with others 5 Enjoyment of life 5 What TIME of day is your pain at its worst? all Sleep (in general) Fair  Pain is worse with: bending, sitting, standing and some activites Pain improves with: rest, heat/ice and medication Relief from Meds: 5  Mobility walk without assistance ability to climb steps?  yes do you drive?  yes Do you have any goals in this area?  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  no  Neuro/Psych spasms depression anxiety  Prior Studies Any changes since last visit?  no  Physicians involved in your care Any changes since last visit?  no   Family History  Problem Relation Age of Onset  . Heart disease Mother   . Cancer - Other Father        abdominal   . Cancer Father    Social History   Socioeconomic History  . Marital status: Single    Spouse name: Not on file  . Number of children: Not on file  . Years of education: Not on file  . Highest education level: Not on file  Occupational History  . Not on file  Social Needs  . Financial resource strain: Not on file  . Food insecurity:    Worry: Not on file    Inability: Not on file  . Transportation needs:    Medical: Not on file    Non-medical: Not on file  Tobacco Use  . Smoking status: Former  Smoker    Types: Cigarettes    Last attempt to quit: 11/12/2017    Years since quitting: 0.5  . Smokeless tobacco: Never Used  Substance and Sexual Activity  . Alcohol use: Yes    Frequency: Never  . Drug use: No    Comment: SMOKED POT IN MY TEENS  . Sexual activity: Not Currently    Birth control/protection: Implant  Lifestyle  . Physical activity:    Days per week: Not on file    Minutes per session: Not on file  . Stress: Not on file  Relationships  . Social connections:    Talks on phone: Not on file    Gets together: Not on file    Attends religious service: Not on file    Active member of club or organization: Not on file    Attends meetings of clubs or organizations: Not on file    Relationship status: Not on file  Other Topics Concern  . Not on file  Social History Narrative   ** Merged History Encounter **       Past Surgical History:  Procedure Laterality Date  . AORTOGRAM Right 11/13/2017   Procedure: AORTOGRAM WITH LOWER EXTREMITY  RUN OFF;  Surgeon: Elam Dutch, MD;  Location: [place];  Service: Vascular;  Laterality: Right;  . LAPAROSCOPIC TUBAL LIGATION Bilateral 07/26/2016   Procedure: LAPAROSCOPIC TUBAL LIGATION;  Surgeon: Lavonia Drafts, MD;  Location: [facility];  Service: Gynecology;  Laterality: Bilateral;  . ORIF TIBIA PLATEAU Right 11/17/2017   Procedure: OPEN REDUCTION INTERNAL FIXATION (ORIF) TIBIAL PLATEAU;  Surgeon: Altamese [place], MD;  Location: [place];  Service: Orthopedics;  Laterality: Right;  . TIBIA IM NAIL INSERTION Right 11/17/2017   Procedure: INTRAMEDULLARY (IM) NAIL TIBIAL;  Surgeon: Altamese [place], MD;  Location: [place];  Service: Orthopedics;  Laterality: Right;  . TUBAL LIGATION  07/2016  . WISDOM TOOTH EXTRACTION     Past Medical History:  Diagnosis Date  . Anxiety    no meds currently  . Anxiety   . Depression    no meds currently  . Depression   . GERD (gastroesophageal reflux disease)   . Headache    otc med prn  .  History of kidney stones   . Insomnia   . SVD (spontaneous vaginal delivery)    x 2   BP (!) 145/95   Pulse 95   Resp 14   Ht 5\' 9"  (1.753 m)   Wt 129 lb (58.5 kg)   SpO2 98%   BMI 19.05 kg/m   Opioid Risk Score:   Fall Risk Score:  `1  Depression screen PHQ 2/9  No flowsheet data found.  Review of Systems  Constitutional: Negative.   HENT: Negative.   Eyes: Negative.   Respiratory: Negative.   Cardiovascular: Negative.   Gastrointestinal: Negative.   Genitourinary: Negative.   Musculoskeletal: Positive for back pain.       Spasms   Skin: Negative.   Allergic/Immunologic: Negative.   Neurological: Negative.   Hematological: Negative.   Psychiatric/Behavioral: Positive for dysphoric mood. The patient is nervous/anxious.   All other systems reviewed and are negative.      Objective:   Physical Exam General: No acute distress HEENT: EOMI, oral membranes moist Cards: reg rate  Chest: normal effort Abdomen: Soft, NT, ND Skin: dry, intact Extremities: no edema   Skin:head wound with scar. Scars on RLE healed.  Extremities: no edema Musculoskeletal: tender to palpation in mid thoracic spine. poor rotation and scapular rom. Cannot bend without pain either. lower back with minimal tenderess  Neuro; appropriate cognitively Motor: 5/5 bilateral deltoid, bicep, tricep, grip Still some weakness R ADFSensation intact light touch  Left lower extremity is 5/5 in the hip flexor knee extensor ankle dorsiflexor.  Psychiatric:pleaasant      Assessment & Plan:  Medical Problem List and Plan: 1.Gait disorder and decline in ADL functionsecondary to polytrauma with right acetabular fracture, right tib-fib fracture status post IM nail, right bicondylar tibial fracture status post ORIF, right peroneal nerve injury, mild TBI, T12 fracturewithout spinal cord injury -continue per ortho plan  -new mid back pain   -check  xrays   -tylenol   -robaxin   -HEP, stretching 2.  Pain Management:as above    3minutes of face to face patient care time were spent during this visit. All questions were encouraged and answered.  Follow up with NP in about a month.

## 2018-06-12 NOTE — Patient Instructions (Signed)
YOU MAY TAKE UP TO 2500MG  OF ACETAMINOPHEN DAILY   REGULAR HEAT  STRETCHING OF YOUR BACK ONCE PAIN AND SPASMS ARE LESS

## 2018-06-13 IMAGING — DX DG CHEST 1V PORT
1 series · 1 of 1 positions shown · non-contrast
Comparison: 11/19/2017

CLINICAL DATA: Leukocytosis.

EXAM:
PORTABLE CHEST 1 VIEW

[chest ap]
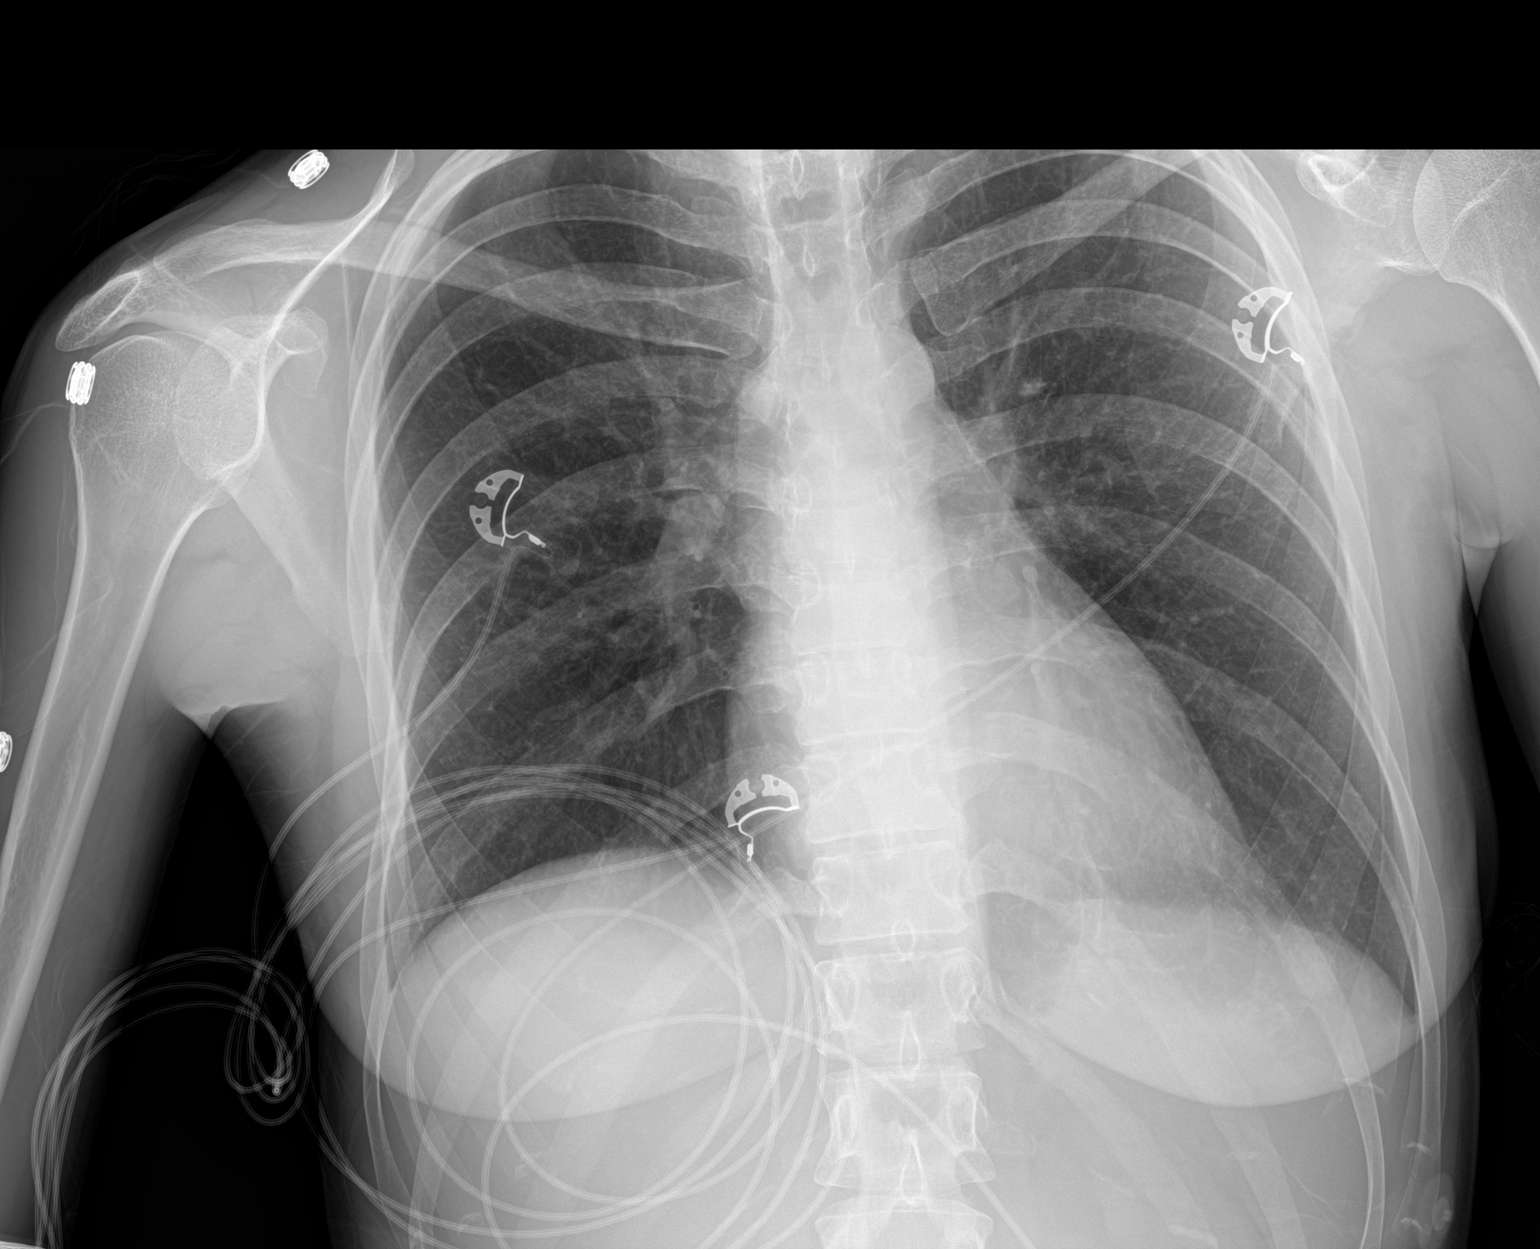

[1 of 1 positions shown; findings below may reference images not displayed]

FINDINGS: Heart and mediastinal contours are within normal limits. No focal
opacities or effusions. No acute bony abnormality.
IMPRESSION: No active disease.

## 2018-06-21 NOTE — Pre-Procedure Instructions (Signed)
Kristine Mosley  06/21/2018      [id number] - [place], [place] [address] [place] [address] Phone: [phone] Fax: [phone]    Your procedure is scheduled on September 10  Report to [facility] at 0600 A.M.  Call this number if you have problems the morning of surgery:  [phone]   Remember:  Do not eat or drink after midnight.      Take these medicines the morning of surgery with A SIP OF WATER  acetaminophen (TYLENOL) if needed methocarbamol (ROBAXIN)  If needed  7 days prior to surgery STOP taking any Aspirin(unless otherwise instructed by your surgeon), Aleve, Naproxen, Ibuprofen, Motrin, Advil, Goody's, BC's, all herbal medications, fish oil, and all vitamins     Do not wear jewelry, make-up or nail polish.  Do not wear lotions, powders, or perfumes, or deodorant.  Do not shave 48 hours prior to surgery.    Do not bring valuables to the hospital.  [facility] is not responsible for any belongings or valuables.  Contacts, dentures or bridgework may not be worn into surgery.  Leave your suitcase in the car.  After surgery it may be brought to your room.  For patients admitted to the hospital, discharge time will be determined by your treatment team.  Patients discharged the day of surgery will not be allowed to drive home.    Special instructions:   [facility]- Preparing For Surgery  Before surgery, you can play an important role. Because skin is not sterile, your skin needs to be as free of germs as possible. You can reduce the number of germs on your skin by washing with CHG (chlorahexidine gluconate) Soap before surgery.  CHG is an antiseptic cleaner which kills germs and bonds with the skin to continue killing germs even after washing.    Oral Hygiene is also important to reduce your risk of infection.  Remember - BRUSH YOUR TEETH THE MORNING OF  SURGERY WITH YOUR REGULAR TOOTHPASTE  Please do not use if you have an allergy to CHG or antibacterial soaps. If your skin becomes reddened/irritated stop using the CHG.  Do not shave (including legs and underarms) for at least 48 hours prior to first CHG shower. It is OK to shave your face.  Please follow these instructions carefully.   1. Shower the NIGHT BEFORE SURGERY and the MORNING OF SURGERY with CHG.   2. If you chose to wash your hair, wash your hair first as usual with your normal shampoo.  3. After you shampoo, rinse your hair and body thoroughly to remove the shampoo.  4. Use CHG as you would any other liquid soap. You can apply CHG directly to the skin and wash gently with a scrungie or a clean washcloth.   5. Apply the CHG Soap to your body ONLY FROM THE NECK DOWN.  Do not use on open wounds or open sores. Avoid contact with your eyes, ears, mouth and genitals (private parts). Wash Face and genitals (private parts)  with your normal soap.  6. Wash thoroughly, paying special attention to the area where your surgery will be performed.  7. Thoroughly rinse your body with warm water from the neck down.  8. DO NOT shower/wash with your normal soap after using and rinsing off the CHG Soap.  9. Pat yourself dry with a CLEAN TOWEL.  10. Wear CLEAN PAJAMAS to  bed the night before surgery, wear comfortable clothes the morning of surgery  11. Place CLEAN SHEETS on your bed the night of your first shower and DO NOT SLEEP WITH PETS.    Day of Surgery:  Do not apply any deodorants/lotions.  Please wear clean clothes to the hospital/surgery center.   Remember to brush your teeth WITH YOUR REGULAR TOOTHPASTE.    Please read over the following fact sheets that you were given.

## 2018-06-21 NOTE — Progress Notes (Addendum)
PCP: [facility]  Cardiologist: pt denies  EKG: pt denies past year  Stress test: pt denies  ECHO: pt denies  Cardiac Cath: pt denies  Chest x-ray:  pt denies past year, no recent respiratory infections/complications

## 2018-06-22 ENCOUNTER — Encounter (HOSPITAL_COMMUNITY): Payer: Self-pay

## 2018-06-22 ENCOUNTER — Encounter (HOSPITAL_COMMUNITY)
Admission: RE | Admit: 2018-06-22 | Discharge: 2018-06-22 | Disposition: A | Discharge status: Home / Self Care | Payer: Medicaid Other | Source: Ambulatory Visit | Attending: Orthopedic Surgery | Admitting: Orthopedic Surgery

## 2018-06-22 ENCOUNTER — Other Ambulatory Visit: Payer: Self-pay

## 2018-06-22 DIAGNOSIS — Y838 Other surgical procedures as the cause of abnormal reaction of the patient, or of later complication, without mention of misadventure at the time of the procedure: Secondary | ICD-10-CM | POA: Diagnosis not present

## 2018-06-22 DIAGNOSIS — Z01818 Encounter for other preprocedural examination: Secondary | ICD-10-CM | POA: Insufficient documentation

## 2018-06-22 DIAGNOSIS — Z472 Encounter for removal of internal fixation device: Secondary | ICD-10-CM | POA: Insufficient documentation

## 2018-06-22 LAB — CBC
HEMATOCRIT: 44.4 % (ref 36.0–46.0)
HEMOGLOBIN: 14.2 g/dL (ref 12.0–15.0)
MCH: 28.6 pg (ref 26.0–34.0)
MCHC: 32 g/dL (ref 30.0–36.0)
MCV: 89.5 fL (ref 78.0–100.0)
Platelets: 211 10*3/uL (ref 150–400)
RBC: 4.96 MIL/uL (ref 3.87–5.11)
RDW: 13.5 % (ref 11.5–15.5)
WBC: 6.3 10*3/uL (ref 4.0–10.5)

## 2018-06-26 NOTE — Anesthesia Preprocedure Evaluation (Addendum)
Anesthesia Evaluation  Patient identified by MRN, date of birth, ID band Patient awake    Reviewed: Allergy & Precautions, NPO status , Patient's Chart, lab work & pertinent test results  Airway Mallampati: II  TM Distance: >3 FB Neck ROM: Full    Dental no notable dental hx. (+) Edentulous Upper, Edentulous Lower   Pulmonary former smoker,    Pulmonary exam normal breath sounds clear to auscultation       Cardiovascular Exercise Tolerance: Good negative cardio ROS Normal cardiovascular exam Rhythm:Regular Rate:Normal     Neuro/Psych  Headaches, PSYCHIATRIC DISORDERS    GI/Hepatic Neg liver ROS, GERD  ,  Endo/Other    Renal/GU negative Renal ROS     Musculoskeletal   Abdominal   Peds  Hematology  (+) anemia ,   Anesthesia Other Findings   Reproductive/Obstetrics negative OB ROS                            Anesthesia Physical Anesthesia Plan  ASA: I  Anesthesia Plan: General   Post-op Pain Management:  Regional for Post-op pain   Induction: Intravenous  PONV Risk Score and Plan: Treatment may vary due to age or medical condition and Ondansetron  Airway Management Planned: LMA  Additional Equipment:   Intra-op Plan:   Post-operative Plan:   Informed Consent: I have reviewed the patients History and Physical, chart, labs and discussed the procedure including the risks, benefits and alternatives for the proposed anesthesia with the patient or authorized representative who has indicated his/her understanding and acceptance.   Dental advisory given  Plan Discussed with: CRNA  Anesthesia Plan Comments:         Anesthesia Quick Evaluation

## 2018-06-27 ENCOUNTER — Encounter (HOSPITAL_COMMUNITY): Payer: Self-pay | Admitting: *Deleted

## 2018-06-27 ENCOUNTER — Ambulatory Visit (HOSPITAL_COMMUNITY)
Admission: RE | Admit: 2018-06-27 | Discharge: 2018-06-27 | Disposition: A | Discharge status: Home / Self Care | Payer: Medicaid Other | Source: Ambulatory Visit | Attending: Orthopedic Surgery | Admitting: Orthopedic Surgery

## 2018-06-27 ENCOUNTER — Ambulatory Visit (HOSPITAL_COMMUNITY): Payer: Medicaid Other

## 2018-06-27 ENCOUNTER — Ambulatory Visit (HOSPITAL_COMMUNITY): Payer: Medicaid Other | Admitting: Anesthesiology

## 2018-06-27 ENCOUNTER — Other Ambulatory Visit: Payer: Self-pay

## 2018-06-27 ENCOUNTER — Encounter (HOSPITAL_COMMUNITY)
Admission: RE | Disposition: A | Discharge status: Home / Self Care | Payer: Self-pay | Source: Ambulatory Visit | Attending: Orthopedic Surgery

## 2018-06-27 DIAGNOSIS — Y793 Surgical instruments, materials and orthopedic devices (including sutures) associated with adverse incidents: Secondary | ICD-10-CM | POA: Insufficient documentation

## 2018-06-27 DIAGNOSIS — F329 Major depressive disorder, single episode, unspecified: Secondary | ICD-10-CM | POA: Insufficient documentation

## 2018-06-27 DIAGNOSIS — Z87891 Personal history of nicotine dependence: Secondary | ICD-10-CM | POA: Insufficient documentation

## 2018-06-27 DIAGNOSIS — Z79899 Other long term (current) drug therapy: Secondary | ICD-10-CM | POA: Insufficient documentation

## 2018-06-27 DIAGNOSIS — T8484XA Pain due to internal orthopedic prosthetic devices, implants and grafts, initial encounter: Secondary | ICD-10-CM | POA: Diagnosis present

## 2018-06-27 DIAGNOSIS — Z87442 Personal history of urinary calculi: Secondary | ICD-10-CM | POA: Diagnosis not present

## 2018-06-27 DIAGNOSIS — Z888 Allergy status to other drugs, medicaments and biological substances status: Secondary | ICD-10-CM | POA: Diagnosis not present

## 2018-06-27 DIAGNOSIS — G4700 Insomnia, unspecified: Secondary | ICD-10-CM | POA: Insufficient documentation

## 2018-06-27 DIAGNOSIS — Y929 Unspecified place or not applicable: Secondary | ICD-10-CM | POA: Insufficient documentation

## 2018-06-27 DIAGNOSIS — F419 Anxiety disorder, unspecified: Secondary | ICD-10-CM | POA: Diagnosis not present

## 2018-06-27 DIAGNOSIS — K219 Gastro-esophageal reflux disease without esophagitis: Secondary | ICD-10-CM | POA: Diagnosis not present

## 2018-06-27 DIAGNOSIS — Z419 Encounter for procedure for purposes other than remedying health state, unspecified: Secondary | ICD-10-CM

## 2018-06-27 HISTORY — PX: HARDWARE REMOVAL: SHX979

## 2018-06-27 LAB — POCT PREGNANCY, URINE: Preg Test, Ur: NEGATIVE

## 2018-06-27 SURGERY — REMOVAL, HARDWARE
Anesthesia: General | Laterality: Right

## 2018-06-27 MED ORDER — PROPOFOL 10 MG/ML IV BOLUS
INTRAVENOUS | Status: AC
  Filled 2018-06-27: qty 20

## 2018-06-27 MED ORDER — FENTANYL CITRATE (PF) 250 MCG/5ML IJ SOLN
INTRAMUSCULAR | Status: DC | PRN
  Administered 2018-06-27: 50 ug via INTRAVENOUS
  Administered 2018-06-27: 100 ug via INTRAVENOUS
  Administered 2018-06-27 (×2): 50 ug via INTRAVENOUS

## 2018-06-27 MED ORDER — ONDANSETRON HCL 4 MG/2ML IJ SOLN
INTRAMUSCULAR | Status: AC
  Filled 2018-06-27: qty 4

## 2018-06-27 MED ORDER — CEFAZOLIN SODIUM-DEXTROSE 2-4 GM/100ML-% IV SOLN
2.0000 g | INTRAVENOUS | Status: AC
  Administered 2018-06-27: 2 g via INTRAVENOUS

## 2018-06-27 MED ORDER — MIDAZOLAM HCL 2 MG/2ML IJ SOLN
INTRAMUSCULAR | Status: AC
  Filled 2018-06-27: qty 2

## 2018-06-27 MED ORDER — GABAPENTIN 300 MG PO CAPS: 300.0000 mg | ORAL_CAPSULE | Freq: Once | ORAL | Status: DC

## 2018-06-27 MED ORDER — ONDANSETRON 4 MG PO TBDP: 4.0000 mg | ORAL_TABLET | Freq: Three times a day (TID) | ORAL | 0 refills | Status: AC | PRN

## 2018-06-27 MED ORDER — ACETAMINOPHEN 10 MG/ML IV SOLN: 1000.0000 mg | Freq: Once | INTRAVENOUS | Status: DC | PRN

## 2018-06-27 MED ORDER — BUPIVACAINE-EPINEPHRINE (PF) 0.25% -1:200000 IJ SOLN
INTRAMUSCULAR | Status: AC
  Filled 2018-06-27: qty 30

## 2018-06-27 MED ORDER — EPHEDRINE SULFATE 50 MG/ML IJ SOLN
INTRAMUSCULAR | Status: DC | PRN
  Administered 2018-06-27: 10 mg via INTRAVENOUS

## 2018-06-27 MED ORDER — ACETAMINOPHEN 500 MG PO TABS: 1000.0000 mg | ORAL_TABLET | Freq: Once | ORAL | Status: DC

## 2018-06-27 MED ORDER — CEFAZOLIN SODIUM-DEXTROSE 2-4 GM/100ML-% IV SOLN
INTRAVENOUS | Status: AC
  Filled 2018-06-27: qty 100

## 2018-06-27 MED ORDER — GLYCOPYRROLATE 0.2 MG/ML IJ SOLN
INTRAMUSCULAR | Status: DC | PRN
  Administered 2018-06-27: 0.2 mg via INTRAVENOUS

## 2018-06-27 MED ORDER — DEXAMETHASONE SODIUM PHOSPHATE 10 MG/ML IJ SOLN
INTRAMUSCULAR | Status: DC | PRN
  Administered 2018-06-27: 10 mg via INTRAVENOUS

## 2018-06-27 MED ORDER — LACTATED RINGERS IV SOLN
INTRAVENOUS | Status: DC
  Administered 2018-06-27 (×2): via INTRAVENOUS

## 2018-06-27 MED ORDER — DEXAMETHASONE SODIUM PHOSPHATE 10 MG/ML IJ SOLN
INTRAMUSCULAR | Status: AC
  Filled 2018-06-27: qty 1

## 2018-06-27 MED ORDER — PROPOFOL 10 MG/ML IV BOLUS
INTRAVENOUS | Status: DC | PRN
  Administered 2018-06-27: 200 mg via INTRAVENOUS

## 2018-06-27 MED ORDER — MEPERIDINE HCL 50 MG/ML IJ SOLN: 6.2500 mg | INTRAMUSCULAR | Status: DC | PRN

## 2018-06-27 MED ORDER — HYDROCODONE-ACETAMINOPHEN 7.5-325 MG PO TABS: 1.0000 | ORAL_TABLET | Freq: Once | ORAL | Status: DC | PRN

## 2018-06-27 MED ORDER — KETOROLAC TROMETHAMINE 10 MG PO TABS: 10.0000 mg | ORAL_TABLET | Freq: Four times a day (QID) | ORAL | 0 refills | Status: AC | PRN

## 2018-06-27 MED ORDER — EPHEDRINE 5 MG/ML INJ
INTRAVENOUS | Status: AC
  Filled 2018-06-27: qty 10

## 2018-06-27 MED ORDER — GLYCOPYRROLATE PF 0.2 MG/ML IJ SOSY
PREFILLED_SYRINGE | INTRAMUSCULAR | Status: AC
  Filled 2018-06-27: qty 1

## 2018-06-27 MED ORDER — KETOROLAC TROMETHAMINE 30 MG/ML IJ SOLN
30.0000 mg | Freq: Once | INTRAMUSCULAR | Status: AC
  Administered 2018-06-27: 30 mg via INTRAVENOUS
  Filled 2018-06-27: qty 1

## 2018-06-27 MED ORDER — CHLORHEXIDINE GLUCONATE 4 % EX LIQD: 60.0000 mL | Freq: Once | CUTANEOUS | Status: DC

## 2018-06-27 MED ORDER — ACETAMINOPHEN 500 MG PO TABS
ORAL_TABLET | ORAL | Status: AC
  Administered 2018-06-27: 1000 mg
  Filled 2018-06-27: qty 2

## 2018-06-27 MED ORDER — FENTANYL CITRATE (PF) 250 MCG/5ML IJ SOLN
INTRAMUSCULAR | Status: AC
  Filled 2018-06-27: qty 5

## 2018-06-27 MED ORDER — HYDROMORPHONE HCL 1 MG/ML IJ SOLN: 0.2500 mg | INTRAMUSCULAR | Status: DC | PRN

## 2018-06-27 MED ORDER — MIDAZOLAM HCL 5 MG/5ML IJ SOLN
INTRAMUSCULAR | Status: DC | PRN
  Administered 2018-06-27: 2 mg via INTRAVENOUS

## 2018-06-27 MED ORDER — 0.9 % SODIUM CHLORIDE (POUR BTL) OPTIME
TOPICAL | Status: DC | PRN
  Administered 2018-06-27: 1000 mL

## 2018-06-27 MED ORDER — LIDOCAINE HCL (CARDIAC) PF 100 MG/5ML IV SOSY
PREFILLED_SYRINGE | INTRAVENOUS | Status: DC | PRN
  Administered 2018-06-27: 100 mg via INTRAVENOUS

## 2018-06-27 MED ORDER — HYDROCODONE-ACETAMINOPHEN 5-325 MG PO TABS: 1.0000 | ORAL_TABLET | Freq: Four times a day (QID) | ORAL | 0 refills | Status: AC | PRN

## 2018-06-27 MED ORDER — PROMETHAZINE HCL 25 MG/ML IJ SOLN: 6.2500 mg | INTRAMUSCULAR | Status: DC | PRN

## 2018-06-27 MED ORDER — BUPIVACAINE-EPINEPHRINE (PF) 0.25% -1:200000 IJ SOLN
INTRAMUSCULAR | Status: DC | PRN
  Administered 2018-06-27: 20 mL via PERINEURAL

## 2018-06-27 MED ORDER — LIDOCAINE 2% (20 MG/ML) 5 ML SYRINGE
INTRAMUSCULAR | Status: AC
  Filled 2018-06-27: qty 10

## 2018-06-27 MED ORDER — ONDANSETRON HCL 4 MG/2ML IJ SOLN
INTRAMUSCULAR | Status: DC | PRN
  Administered 2018-06-27: 4 mg via INTRAVENOUS

## 2018-06-27 MED ORDER — GABAPENTIN 300 MG PO CAPS
ORAL_CAPSULE | ORAL | Status: AC
  Administered 2018-06-27: 300 mg
  Filled 2018-06-27: qty 1

## 2018-06-27 SURGICAL SUPPLY — 56 items
BANDAGE ACE 4X5 VEL STRL LF (GAUZE/BANDAGES/DRESSINGS) ×3 IMPLANT
BANDAGE ACE 6X5 VEL STRL LF (GAUZE/BANDAGES/DRESSINGS) ×3 IMPLANT
BANDAGE ESMARK 6X9 LF (GAUZE/BANDAGES/DRESSINGS) ×1 IMPLANT
BNDG COHESIVE 6X5 TAN STRL LF (GAUZE/BANDAGES/DRESSINGS) ×3 IMPLANT
BNDG ESMARK 6X9 LF (GAUZE/BANDAGES/DRESSINGS) ×3
BNDG GAUZE ELAST 4 BULKY (GAUZE/BANDAGES/DRESSINGS) ×6 IMPLANT
BRUSH SCRUB SURG 4.25 DISP (MISCELLANEOUS) ×6 IMPLANT
CLOSURE WOUND 1/2 X4 (GAUZE/BANDAGES/DRESSINGS)
COVER SURGICAL LIGHT HANDLE (MISCELLANEOUS) ×6 IMPLANT
CUFF TOURNIQUET SINGLE 34IN LL (TOURNIQUET CUFF) ×3 IMPLANT
DRAPE C-ARM 42X72 X-RAY (DRAPES) IMPLANT
DRAPE C-ARMOR (DRAPES) ×3 IMPLANT
DRAPE U-SHAPE 47X51 STRL (DRAPES) ×3 IMPLANT
DRSG ADAPTIC 3X8 NADH LF (GAUZE/BANDAGES/DRESSINGS) ×3 IMPLANT
DRSG MEPILEX BORDER 4X4 (GAUZE/BANDAGES/DRESSINGS) ×3 IMPLANT
DRSG MEPILEX BORDER 4X8 (GAUZE/BANDAGES/DRESSINGS) ×3 IMPLANT
ELECT REM PT RETURN 9FT ADLT (ELECTROSURGICAL) ×3
ELECTRODE REM PT RTRN 9FT ADLT (ELECTROSURGICAL) ×1 IMPLANT
GAUZE SPONGE 4X4 12PLY STRL (GAUZE/BANDAGES/DRESSINGS) ×3 IMPLANT
GLOVE BIO SURGEON STRL SZ7.5 (GLOVE) ×3 IMPLANT
GLOVE BIO SURGEON STRL SZ8 (GLOVE) ×3 IMPLANT
GLOVE BIOGEL PI IND STRL 7.5 (GLOVE) ×1 IMPLANT
GLOVE BIOGEL PI IND STRL 8 (GLOVE) ×1 IMPLANT
GLOVE BIOGEL PI INDICATOR 7.5 (GLOVE) ×2
GLOVE BIOGEL PI INDICATOR 8 (GLOVE) ×2
GOWN STRL REUS W/ TWL LRG LVL3 (GOWN DISPOSABLE) ×2 IMPLANT
GOWN STRL REUS W/ TWL XL LVL3 (GOWN DISPOSABLE) ×1 IMPLANT
GOWN STRL REUS W/TWL LRG LVL3 (GOWN DISPOSABLE) ×4
GOWN STRL REUS W/TWL XL LVL3 (GOWN DISPOSABLE) ×2
KIT BASIN OR (CUSTOM PROCEDURE TRAY) ×3 IMPLANT
KIT TURNOVER KIT B (KITS) ×3 IMPLANT
MANIFOLD NEPTUNE II (INSTRUMENTS) ×3 IMPLANT
NEEDLE 22X1 1/2 (OR ONLY) (NEEDLE) IMPLANT
NS IRRIG 1000ML POUR BTL (IV SOLUTION) ×3 IMPLANT
PACK ORTHO EXTREMITY (CUSTOM PROCEDURE TRAY) ×3 IMPLANT
PAD ARMBOARD 7.5X6 YLW CONV (MISCELLANEOUS) ×6 IMPLANT
PADDING CAST COTTON 6X4 STRL (CAST SUPPLIES) ×9 IMPLANT
SPONGE LAP 18X18 X RAY DECT (DISPOSABLE) IMPLANT
STAPLER VISISTAT 35W (STAPLE) IMPLANT
STRIP CLOSURE SKIN 1/2X4 (GAUZE/BANDAGES/DRESSINGS) IMPLANT
SUCTION FRAZIER HANDLE 10FR (MISCELLANEOUS) ×2
SUCTION TUBE FRAZIER 10FR DISP (MISCELLANEOUS) ×1 IMPLANT
SUT ETHILON 2 0 FS 18 (SUTURE) ×3 IMPLANT
SUT ETHILON 3 0 PS 1 (SUTURE) ×3 IMPLANT
SUT VIC AB 0 CT1 27 (SUTURE) ×2
SUT VIC AB 0 CT1 27XBRD ANBCTR (SUTURE) ×1 IMPLANT
SUT VIC AB 2-0 CT1 27 (SUTURE) ×4
SUT VIC AB 2-0 CT1 TAPERPNT 27 (SUTURE) ×2 IMPLANT
SYR CONTROL 10ML LL (SYRINGE) ×3 IMPLANT
TOWEL OR 17X24 6PK STRL BLUE (TOWEL DISPOSABLE) ×6 IMPLANT
TOWEL OR 17X26 10 PK STRL BLUE (TOWEL DISPOSABLE) ×6 IMPLANT
TUBE CONNECTING 12'X1/4 (SUCTIONS) ×1
TUBE CONNECTING 12X1/4 (SUCTIONS) ×2 IMPLANT
UNDERPAD 30X30 (UNDERPADS AND DIAPERS) ×3 IMPLANT
WATER STERILE IRR 1000ML POUR (IV SOLUTION) IMPLANT
YANKAUER SUCT BULB TIP NO VENT (SUCTIONS) ×3 IMPLANT

## 2018-06-27 NOTE — H&P (Addendum)
Orthopaedic Trauma Service H&P/Consult     Patient ID: AMICA HARRON MRN: [id number] DOB/AGE: 30-24-1987 30 y.o.  Chief Complaint: symptomatic hardware HPI: Kristine Mosley is an 32 y.o. female.s/p ORIF of right tibial plateau and IMN of right tibial shaft fracture with persistent pain and tenderness.   Past Medical History:  Diagnosis Date  . Anxiety    no meds currently  . Anxiety   . Depression    no meds currently  . Depression   . GERD (gastroesophageal reflux disease)   . Headache    otc med prn  . History of kidney stones   . Insomnia   . SVD (spontaneous vaginal delivery)    x 2    Past Surgical History:  Procedure Laterality Date  . AORTOGRAM Right 11/13/2017   Procedure: AORTOGRAM WITH LOWER EXTREMITY RUN OFF;  Surgeon: Elam Dutch, MD;  Location: [facility];  Service: Vascular;  Laterality: Right;  . LAPAROSCOPIC TUBAL LIGATION Bilateral 07/26/2016   Procedure: LAPAROSCOPIC TUBAL LIGATION;  Surgeon: Lavonia Drafts, MD;  Location: [facility];  Service: Gynecology;  Laterality: Bilateral;  . MULTIPLE TOOTH EXTRACTIONS     patient had all teeth extract-2018  . ORIF TIBIA PLATEAU Right 11/17/2017   Procedure: OPEN REDUCTION INTERNAL FIXATION (ORIF) TIBIAL PLATEAU;  Surgeon: Altamese [place], MD;  Location: [facility];  Service: Orthopedics;  Laterality: Right;  . TIBIA IM NAIL INSERTION Right 11/17/2017   Procedure: INTRAMEDULLARY (IM) NAIL TIBIAL;  Surgeon: Altamese Throop, MD;  Location: [facility];  Service: Orthopedics;  Laterality: Right;  . TUBAL LIGATION  07/2016  . WISDOM TOOTH EXTRACTION      Family History  Problem Relation Age of Onset  . Heart disease Mother   . Cancer - Other Father        abdominal   . Cancer Father    Social History:  reports that she quit smoking about 7 months ago. Her smoking use included cigarettes. She has never used smokeless tobacco. She reports that she drank alcohol. She reports that she does not use drugs.  Allergies:   Allergies  Allergen Reactions  . Bee Venom Hives    Pt takes benadryl  . Coconut Oil Hives and Swelling  . Lavender Oil Hives    Medications Prior to Admission  Medication Sig Dispense Refill  . acetaminophen (TYLENOL) 325 MG tablet Take 1-2 tablets (325-650 mg total) by mouth every 4 (four) hours as needed for mild pain. 60 tablet 0  . methocarbamol (ROBAXIN) 500 MG tablet Take 1 tablet (500 mg total) by mouth every 6 (six) hours as needed for muscle spasms. 75 tablet 1  . Multiple Vitamin (MULTIVITAMIN WITH MINERALS) TABS tablet Take 1 tablet by mouth daily. 30 tablet 0    Results for orders placed or performed during the hospital encounter of 06/27/18 (from the past 48 hour(s))  Pregnancy, urine POC     Status: None   Collection Time: 06/27/18  8:13 AM  Result Value Ref Range   Preg Test, Ur NEGATIVE NEGATIVE    Comment:        THE SENSITIVITY OF THIS METHODOLOGY IS >24 mIU/mL    No results found.  ROS No recent fever, bleeding abnormalities, urologic dysfunction, GI problems, or weight gain.  Blood pressure 117/85, pulse 72, temperature 98.4 F (36.9 C), temperature source Oral, resp. rate 18, weight 58.5 kg, last menstrual period 06/12/2018, SpO2 100 %. Physical Exam  NCAT, edentulous RRR No wheezing S/NT/ND RLE Very tender over all  hardware  No edema/ swelling  Sens: DPN, SPN, TN intact  Motor: EHL, FHL, and lessor toe ext and flex all intact grossly  DP 2+   Assessment/Plan Symptomatic hardware right tibia for removal proximal plateau and nail  I discussed with the patient the risks and benefits of surgery, including the possibility of infection, nerve injury, vessel injury, wound breakdown, failure to alleviate symptoms, DVT/ PE, loss of motion, and need for further surgery among others.  She acknowledged these risks and wished to proceed.   Weightbearing: WBAT RLE Insicional and dressing care: OK to remove dressings 48 and leave open to air with dry gauze  PRN Orthopedic device(s): None+ Showering: in 24-48 hours VTE prophylaxis: none  Pain control: Toradol and Norco Follow - up plan: 2 weeks Contact information:  Altamese Victor MD, Ainsley Spinner, PA-C   Altamese Elrama, MD [facility], [facility] [phone]  06/27/2018, 9:59 AM

## 2018-06-27 NOTE — Discharge Instructions (Addendum)
Orthopaedic Trauma Service Discharge Instructions   General Discharge Instructions  WEIGHT BEARING STATUS: weightbear as tolerated R leg  RANGE OF MOTION/ACTIVITY: unrestricted ROM R knee and ankle  Wound Care: daily wound care starting on 06/29/2018. See below.  Can expect a significant amount of bleeding. Reinforce dressing as needed   Discharge Wound Care Instructions  Do NOT apply any ointments, solutions or lotions to pin sites or surgical wounds.  These prevent needed drainage and even though solutions like hydrogen peroxide kill bacteria, they also damage cells lining the pin sites that help fight infection.  Applying lotions or ointments can keep the wounds moist and can cause them to breakdown and open up as well. This can increase the risk for infection. When in doubt call the office.  Surgical incisions should be dressed daily.  If any drainage is noted, use one layer of adaptic, then gauze, Kerlix, and an ace wrap.  Once the incision is completely dry and without drainage, it may be left open to air out.  Showering may begin 36-48 hours later.  Cleaning gently with soap and water.  Traumatic wounds should be dressed daily as well.    One layer of adaptic, gauze, Kerlix, then ace wrap.  The adaptic can be discontinued once the draining has ceased    If you have a wet to dry dressing: wet the gauze with saline the squeeze as much saline out so the gauze is moist (not soaking wet), place moistened gauze over wound, then place a dry gauze over the moist one, followed by Kerlix wrap, then ace wrap.   Diet: as you were eating previously.  Can use over the counter stool softeners and bowel preparations, such as Miralax, to help with bowel movements.  Narcotics can be constipating.  Be sure to drink plenty of fluids  PAIN MEDICATION USE AND EXPECTATIONS  You have likely been given narcotic medications to help control your pain.  After a traumatic event that results in an fracture  (broken bone) with or without surgery, it is ok to use narcotic pain medications to help control one's pain.  We understand that everyone responds to pain differently and each individual patient will be evaluated on a regular basis for the continued need for narcotic medications. Ideally, narcotic medication use should last no more than 6-8 weeks (coinciding with fracture healing).   As a patient it is your responsibility as well to monitor narcotic medication use and report the amount and frequency you use these medications when you come to your office visit.   We would also advise that if you are using narcotic medications, you should take a dose prior to therapy to maximize you participation.  IF YOU ARE ON NARCOTIC MEDICATIONS IT IS NOT PERMISSIBLE TO OPERATE A MOTOR VEHICLE (MOTORCYCLE/CAR/TRUCK/MOPED) OR HEAVY MACHINERY DO NOT MIX NARCOTICS WITH OTHER CNS (CENTRAL NERVOUS SYSTEM) DEPRESSANTS SUCH AS ALCOHOL   STOP SMOKING OR USING NICOTINE PRODUCTS!!!!  As discussed nicotine severely impairs your body's ability to heal surgical and traumatic wounds but also impairs bone healing.  Wounds and bone heal by forming microscopic blood vessels (angiogenesis) and nicotine is a vasoconstrictor (essentially, shrinks blood vessels).  Therefore, if vasoconstriction occurs to these microscopic blood vessels they essentially disappear and are unable to deliver necessary nutrients to the healing tissue.  This is one modifiable factor that you can do to dramatically increase your chances of healing your injury.    (This means no smoking, no nicotine gum, patches, etc)  DO NOT USE NONSTEROIDAL ANTI-INFLAMMATORY DRUGS (NSAID'S)  Using products such as Advil (ibuprofen), Aleve (naproxen), Motrin (ibuprofen) for additional pain control during fracture healing can delay and/or prevent the healing response.  If you would like to take over the counter (OTC) medication, Tylenol (acetaminophen) is ok.  However, some  narcotic medications that are given for pain control contain acetaminophen as well. Therefore, you should not exceed more than 4000 mg of tylenol in a day if you do not have liver disease.  Also note that there are may OTC medicines, such as cold medicines and allergy medicines that my contain tylenol as well.  If you have any questions about medications and/or interactions please ask your doctor/PA or your pharmacist.      ICE AND ELEVATE INJURED/OPERATIVE EXTREMITY  Using ice and elevating the injured extremity above your heart can help with swelling and pain control.  Icing in a pulsatile fashion, such as 20 minutes on and 20 minutes off, can be followed.    Do not place ice directly on skin. Make sure there is a barrier between to skin and the ice pack.    Using frozen items such as frozen peas works well as the conform nicely to the are that needs to be iced.  USE AN ACE WRAP OR TED HOSE FOR SWELLING CONTROL  In addition to icing and elevation, Ace wraps or TED hose are used to help limit and resolve swelling.  It is recommended to use Ace wraps or TED hose until you are informed to stop.    When using Ace Wraps start the wrapping distally (farthest away from the body) and wrap proximally (closer to the body)   Example: If you had surgery on your leg or thing and you do not have a splint on, start the ace wrap at the toes and work your way up to the thigh        If you had surgery on your upper extremity and do not have a splint on, start the ace wrap at your fingers and work your way up to the upper arm  IF YOU ARE IN A SPLINT OR CAST DO NOT [place]   If your splint gets wet for any reason please contact the office immediately. You may shower in your splint or cast as long as you keep it dry.  This can be done by wrapping in a cast cover or garbage back (or similar)  Do Not stick any thing down your splint or cast such as pencils, money, or hangers to try and scratch yourself  with.  If you feel itchy take benadryl as prescribed on the bottle for itching  IF YOU ARE IN A CAM BOOT (BLACK BOOT)  You may remove boot periodically. Perform daily dressing changes as noted below.  Wash the liner of the boot regularly and wear a sock when wearing the boot. It is recommended that you sleep in the boot until told otherwise  CALL THE OFFICE WITH ANY QUESTIONS OR CONCERNS: [phone]

## 2018-06-27 NOTE — Op Note (Signed)
06/27/2018  12:16 PM  PATIENT:  Kristine Mosley  32 y.o. female  PRE-OPERATIVE DIAGNOSIS:  SYMPTOMATIC HARDWARE RIGHT TIBIAL PLATEAU AND SHAFT  POST-OPERATIVE DIAGNOSIS:  SYMPTOMATIC HARDWARE RIGHT TIBIAL PLATEAU AND SHAFT  PROCEDURE:  Procedure(s): HARDWARE REMOVALRIGHT TIBIAL (Right)  PLATEAU AND SHAFT  SURGEON:  Surgeon(s) and Role:    Altamese Corwith, MD - Primary  PHYSICIAN ASSISTANT: Ainsley Spinner, PA-C  ANESTHESIA:   general  EBL:  25 mL   BLOOD ADMINISTERED:none  DRAINS: none   LOCAL MEDICATIONS USED:  MARCAINE     SPECIMEN:  No Specimen  DISPOSITION OF SPECIMEN:  N/A  COUNTS:  YES  TOURNIQUET:  * Missing tourniquet times found for documented tourniquets in log: [id number] *  DICTATION: .Note written in EPIC  PLAN OF CARE: Discharge to home after PACU  PATIENT DISPOSITION:  PACU - hemodynamically stable.   Delay start of Pharmacological VTE agent (>24hrs) due to surgical blood loss or risk of bleeding: not applicable  BRIEF SUMMARY OF INDICATION FOR PROCEDURE:  Kristine Mosley is a very pleasant 32 y.o. who sustained right tibial plateau and shaft fracture.  The patient went on to unite and now presents for elective removal of the hardware because of continued tenderness about the implants that did not resolve with observation or conservative measures. The patient and I discussed the risks and benefits of surgery including the possibility of failure to alleviate symptoms, need for further surgery, DVT, PE, heart attack, stroke, anesthetic complications, infection, bleeding and others. The patient wished to proceed and provided consent.  BRIEF SUMMARY OF PROCEDURE:  After administration of preoperative antibiotics, the patient was taken to the operating room where general anesthesia was induced.  The right lower extremity was prepped and draped in usual sterile fashion.  Time-out was held. The old incisions were remade over the tibial plateau plate at knee.  The plate was exposed proximally and all screws removed without complication. A small stab incision was used over a free lag screw along the medial plateau which was also removed without complication.  Attention was turned to the leg.  Here, I remade the old nail incisions used for blocking screw and nail insertion. I cleared the head of each screw with a small clamp tip, then used the screwdriver to withdraw each locking bolt. At the anterior aspect of the knee, I remade the medial parapatellar retinacular incision.  A curette was initially advanced into the center of the nail and then the extraction bolt inserted, engaging it while leaving one screw in distally to prevent rotation. The nail was extracted without difficulty, being careful to avoid injury to the surrounding bone and soft tissues.   The wounds were irrigated thoroughly, closed in standard layered fashion with 0 Vicryl for the retinaculum, 2-0 Vicryl and 2-0 nylon for the skin.  0.25% Marcaine with epi was injected along the wound edges and an additional 5cc's in the knee. Sterile gently compressive dressing was applied and then Ace wrap from foot to thigh.  The patient was awakened from anesthesia and transported to PACU in stable condition.  Ainsley Spinner, PA- C, did assist me throughout with the implant removal by stabilizing the knee, preventing rotation during engagement of the extraction bolt, and wound closure.  PROGNOSIS:  [name] will be weightbearing as tolerated.  Ok to shower in 2 days. Oozing from the bone is anticipated. We will plan to see back for removal of sutures in 10 days. Formal DVT prophylaxis has not  been recommended.     Astrid Divine. Marcelino Scot, M.D.

## 2018-06-27 NOTE — Anesthesia Procedure Notes (Signed)
Procedure Name: LMA Insertion Date/Time: 06/27/2018 10:30 AM Performed by: Mariea Clonts, CRNA Pre-anesthesia Checklist: Patient identified, Emergency Drugs available, Suction available and Patient being monitored Patient Re-evaluated:Patient Re-evaluated prior to induction Oxygen Delivery Method: Circle System Utilized Preoxygenation: Pre-oxygenation with 100% oxygen Induction Type: IV induction Ventilation: Mask ventilation without difficulty LMA: LMA inserted LMA Size: 4.0 Number of attempts: 1 Airway Equipment and Method: Bite block Placement Confirmation: positive ETCO2 Tube secured with: Tape Dental Injury: Teeth and Oropharynx as per pre-operative assessment

## 2018-06-27 NOTE — Anesthesia Postprocedure Evaluation (Signed)
Anesthesia Post Note  Patient: Kristine Mosley  Procedure(s) Performed: HARDWARE REMOVALRIGHT TIBIAL (Right )     Patient location during evaluation: PACU Anesthesia Type: General Level of consciousness: awake and alert Pain management: pain level controlled Vital Signs Assessment: post-procedure vital signs reviewed and stable Respiratory status: spontaneous breathing, nonlabored ventilation, respiratory function stable and patient connected to nasal cannula oxygen Cardiovascular status: blood pressure returned to baseline and stable Postop Assessment: no apparent nausea or vomiting Anesthetic complications: no    Last Vitals:  Vitals:   06/27/18 1255 06/27/18 1311  BP: 119/83 123/88  Pulse: 72 65  Resp:  16  Temp:  (!) 36.3 C  SpO2: 97% 100%    Last Pain:  Vitals:   06/27/18 1210  TempSrc:   PainSc: 2                  Barnet Glasgow

## 2018-06-27 NOTE — Transfer of Care (Signed)
Immediate Anesthesia Transfer of Care Note  Patient: Alphonsa Overall  Procedure(s) Performed: HARDWARE REMOVALRIGHT TIBIAL (Right )  Patient Location: PACU  Anesthesia Type:General  Level of Consciousness: awake, alert  and oriented  Airway & Oxygen Therapy: Patient Spontanous Breathing and Patient connected to nasal cannula oxygen  Post-op Assessment: Report given to RN, Post -op Vital signs reviewed and stable and Patient moving all extremities X 4  Post vital signs: Reviewed and stable  Last Vitals:  Vitals Value Taken Time  BP 139/84 06/27/2018 12:10 PM  Temp    Pulse 93 06/27/2018 12:12 PM  Resp 9 06/27/2018 12:12 PM  SpO2 100 % 06/27/2018 12:12 PM  Vitals shown include unvalidated device data.  Last Pain:  Vitals:   06/27/18 0844  TempSrc:   PainSc: 1          Complications: No apparent anesthesia complications

## 2018-06-28 ENCOUNTER — Encounter (HOSPITAL_COMMUNITY): Payer: Self-pay | Admitting: Orthopedic Surgery

## 2018-07-13 ENCOUNTER — Encounter: Payer: Medicaid Other | Admitting: Registered Nurse

## 2018-07-14 ENCOUNTER — Ambulatory Visit
Admission: RE | Admit: 2018-07-14 | Discharge: 2018-07-14 | Disposition: A | Discharge status: Home / Self Care | Payer: Medicaid Other | Source: Ambulatory Visit | Attending: Physical Medicine & Rehabilitation | Admitting: Physical Medicine & Rehabilitation

## 2018-07-14 DIAGNOSIS — S22089S Unspecified fracture of T11-T12 vertebra, sequela: Secondary | ICD-10-CM

## 2018-07-14 DIAGNOSIS — M546 Pain in thoracic spine: Secondary | ICD-10-CM

## 2018-07-14 DIAGNOSIS — S8411XS Injury of peroneal nerve at lower leg level, right leg, sequela: Secondary | ICD-10-CM

## 2018-07-17 ENCOUNTER — Telehealth: Payer: Self-pay | Admitting: Physical Medicine & Rehabilitation

## 2018-07-17 NOTE — Telephone Encounter (Signed)
Please let [place] know that xrays of back are stable. Old T12 fracture is seen and is improved/stable.   thx

## 2018-07-19 NOTE — Telephone Encounter (Signed)
Pt has been notified.

## 2018-07-21 ENCOUNTER — Encounter: Payer: Medicaid Other | Attending: Physical Medicine & Rehabilitation | Admitting: Registered Nurse

## 2018-07-21 ENCOUNTER — Encounter: Payer: Self-pay | Admitting: Registered Nurse

## 2018-07-21 VITALS — BP 120/86 | HR 87 | Ht 69.0 in | Wt 127.8 lb

## 2018-07-21 DIAGNOSIS — K219 Gastro-esophageal reflux disease without esophagitis: Secondary | ICD-10-CM | POA: Insufficient documentation

## 2018-07-21 DIAGNOSIS — R269 Unspecified abnormalities of gait and mobility: Secondary | ICD-10-CM | POA: Insufficient documentation

## 2018-07-21 DIAGNOSIS — S22089S Unspecified fracture of T11-T12 vertebra, sequela: Secondary | ICD-10-CM | POA: Diagnosis not present

## 2018-07-21 DIAGNOSIS — G4700 Insomnia, unspecified: Secondary | ICD-10-CM | POA: Diagnosis not present

## 2018-07-21 DIAGNOSIS — F419 Anxiety disorder, unspecified: Secondary | ICD-10-CM | POA: Diagnosis not present

## 2018-07-21 DIAGNOSIS — Z87442 Personal history of urinary calculi: Secondary | ICD-10-CM | POA: Insufficient documentation

## 2018-07-21 DIAGNOSIS — S069X0S Unspecified intracranial injury without loss of consciousness, sequela: Secondary | ICD-10-CM | POA: Insufficient documentation

## 2018-07-21 DIAGNOSIS — Z8249 Family history of ischemic heart disease and other diseases of the circulatory system: Secondary | ICD-10-CM | POA: Diagnosis not present

## 2018-07-21 DIAGNOSIS — F329 Major depressive disorder, single episode, unspecified: Secondary | ICD-10-CM | POA: Diagnosis not present

## 2018-07-21 DIAGNOSIS — G8929 Other chronic pain: Secondary | ICD-10-CM

## 2018-07-21 DIAGNOSIS — M546 Pain in thoracic spine: Secondary | ICD-10-CM | POA: Diagnosis not present

## 2018-07-21 DIAGNOSIS — Z87891 Personal history of nicotine dependence: Secondary | ICD-10-CM | POA: Insufficient documentation

## 2018-07-21 DIAGNOSIS — S8411XS Injury of peroneal nerve at lower leg level, right leg, sequela: Secondary | ICD-10-CM | POA: Diagnosis not present

## 2018-07-21 DIAGNOSIS — M545 Low back pain: Secondary | ICD-10-CM

## 2018-07-21 DIAGNOSIS — S82201S Unspecified fracture of shaft of right tibia, sequela: Secondary | ICD-10-CM | POA: Diagnosis not present

## 2018-07-21 DIAGNOSIS — S82401S Unspecified fracture of shaft of right fibula, sequela: Secondary | ICD-10-CM | POA: Insufficient documentation

## 2018-07-21 DIAGNOSIS — M62830 Muscle spasm of back: Secondary | ICD-10-CM

## 2018-07-21 DIAGNOSIS — S32401S Unspecified fracture of right acetabulum, sequela: Secondary | ICD-10-CM | POA: Insufficient documentation

## 2018-07-21 DIAGNOSIS — M549 Dorsalgia, unspecified: Secondary | ICD-10-CM | POA: Insufficient documentation

## 2018-07-21 MED ORDER — CYCLOBENZAPRINE HCL 5 MG PO TABS: 5.0000 mg | ORAL_TABLET | Freq: Every evening | ORAL | 0 refills | Status: AC | PRN

## 2018-07-21 NOTE — Patient Instructions (Signed)
Start Flexeril  Tonight Take 1/2 Tablet at Bedtime   Call office on Monday for Evaluation [phone]
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
## 2018-09-01 ENCOUNTER — Encounter: Payer: Medicaid Other | Attending: Physical Medicine & Rehabilitation | Admitting: Registered Nurse

## 2018-09-01 DIAGNOSIS — Z87442 Personal history of urinary calculi: Secondary | ICD-10-CM | POA: Insufficient documentation

## 2018-09-01 DIAGNOSIS — M549 Dorsalgia, unspecified: Secondary | ICD-10-CM | POA: Insufficient documentation

## 2018-09-01 DIAGNOSIS — S32401S Unspecified fracture of right acetabulum, sequela: Secondary | ICD-10-CM | POA: Insufficient documentation

## 2018-09-01 DIAGNOSIS — S069X0S Unspecified intracranial injury without loss of consciousness, sequela: Secondary | ICD-10-CM | POA: Insufficient documentation

## 2018-09-01 DIAGNOSIS — S22089S Unspecified fracture of T11-T12 vertebra, sequela: Secondary | ICD-10-CM | POA: Insufficient documentation

## 2018-09-01 DIAGNOSIS — Z87891 Personal history of nicotine dependence: Secondary | ICD-10-CM | POA: Insufficient documentation

## 2018-09-01 DIAGNOSIS — Z8249 Family history of ischemic heart disease and other diseases of the circulatory system: Secondary | ICD-10-CM | POA: Insufficient documentation

## 2018-09-01 DIAGNOSIS — G4700 Insomnia, unspecified: Secondary | ICD-10-CM | POA: Insufficient documentation

## 2018-09-01 DIAGNOSIS — S82401S Unspecified fracture of shaft of right fibula, sequela: Secondary | ICD-10-CM | POA: Insufficient documentation

## 2018-09-01 DIAGNOSIS — S82201S Unspecified fracture of shaft of right tibia, sequela: Secondary | ICD-10-CM | POA: Insufficient documentation

## 2018-09-01 DIAGNOSIS — F419 Anxiety disorder, unspecified: Secondary | ICD-10-CM | POA: Insufficient documentation

## 2018-09-01 DIAGNOSIS — R269 Unspecified abnormalities of gait and mobility: Secondary | ICD-10-CM | POA: Insufficient documentation

## 2018-09-01 DIAGNOSIS — F329 Major depressive disorder, single episode, unspecified: Secondary | ICD-10-CM | POA: Insufficient documentation

## 2018-09-01 DIAGNOSIS — K219 Gastro-esophageal reflux disease without esophagitis: Secondary | ICD-10-CM | POA: Insufficient documentation

## 2018-11-21 ENCOUNTER — Ambulatory Visit: Payer: Medicaid Other | Admitting: Psychology

## 2018-11-28 ENCOUNTER — Ambulatory Visit: Payer: Medicaid Other | Admitting: Psychology

## 2019-01-15 IMAGING — RF DG TIBIA/FIBULA 2V*R*
1 series · 4 of 4 positions shown · non-contrast
Comparison: None.

CLINICAL DATA: Hardware removal

EXAM:
DG C-ARM 61-120 MIN; RIGHT TIBIA AND FIBULA - 2 VIEW

[Series 1: run · 4 of 4 slices shown]
[im 1/4]
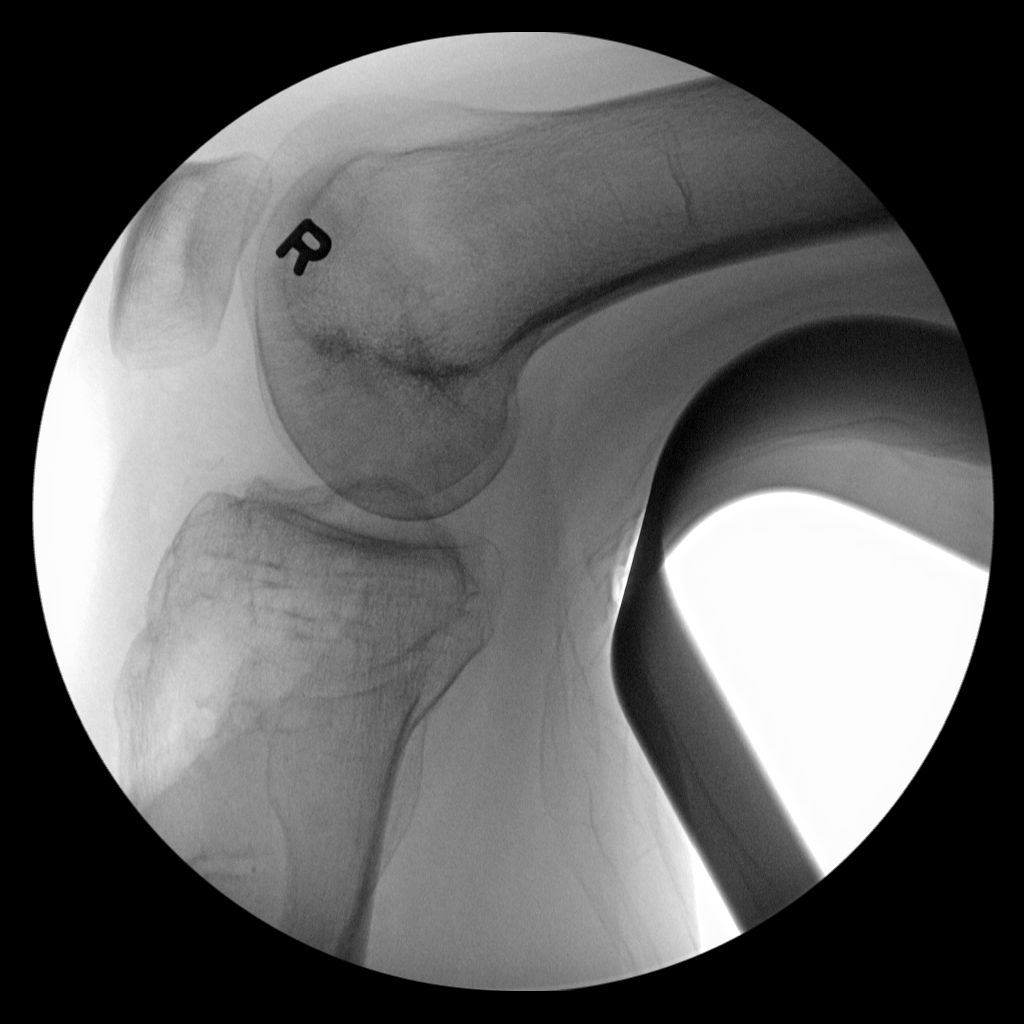
[im 2/4]
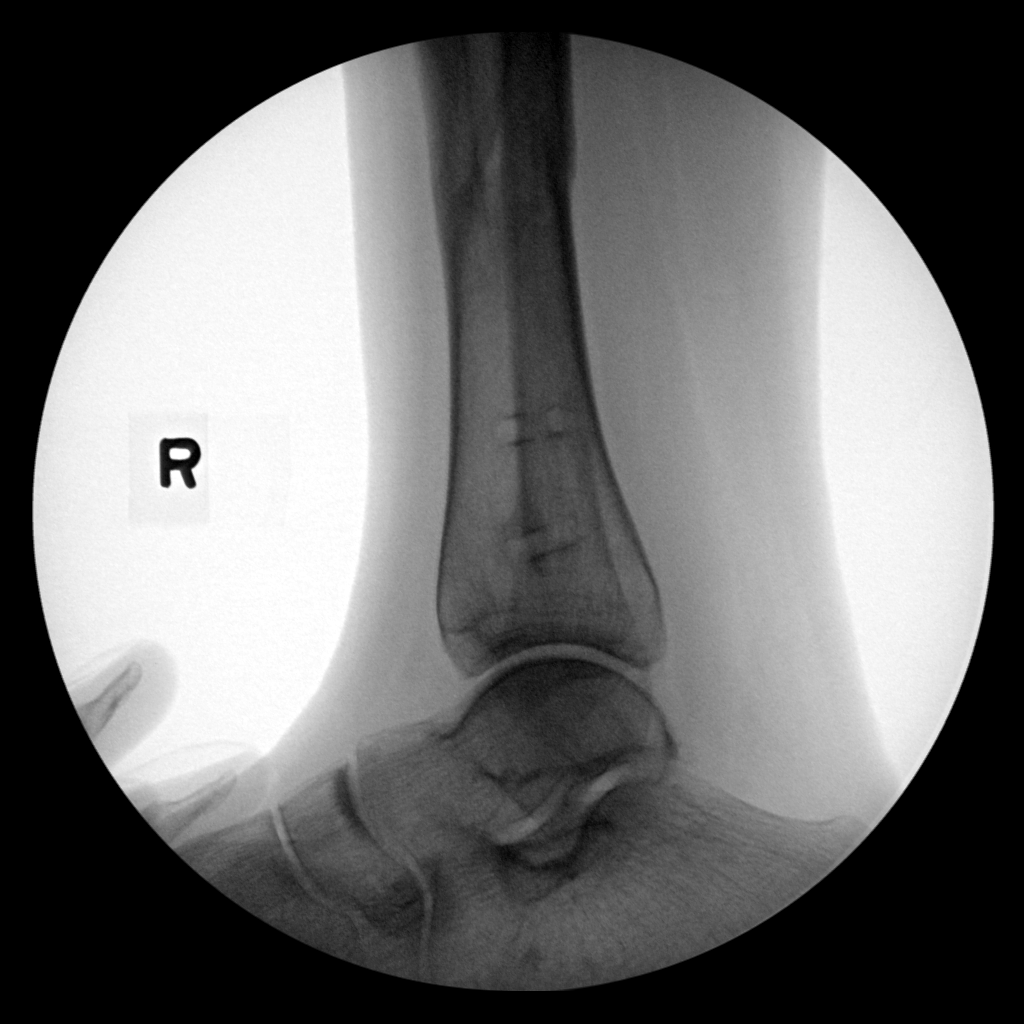
[im 3/4]
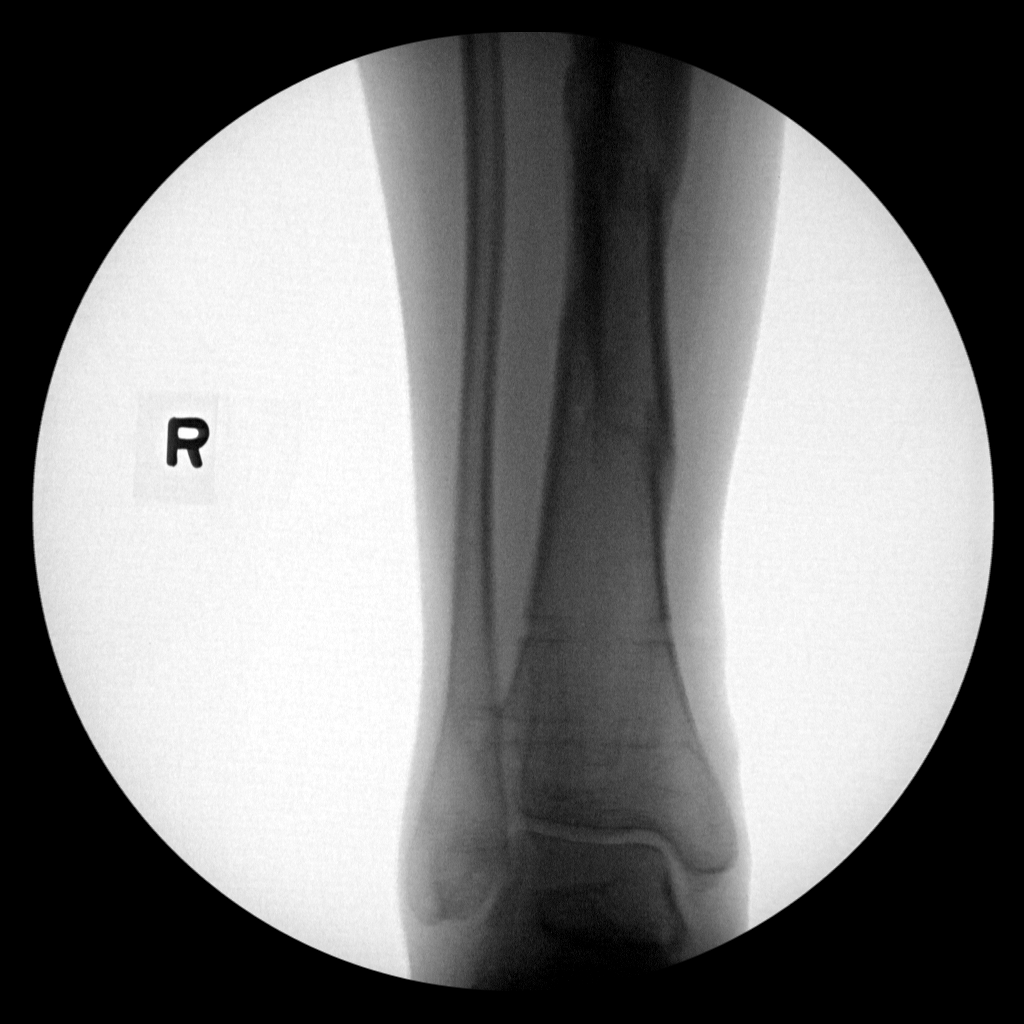
[im 4/4]
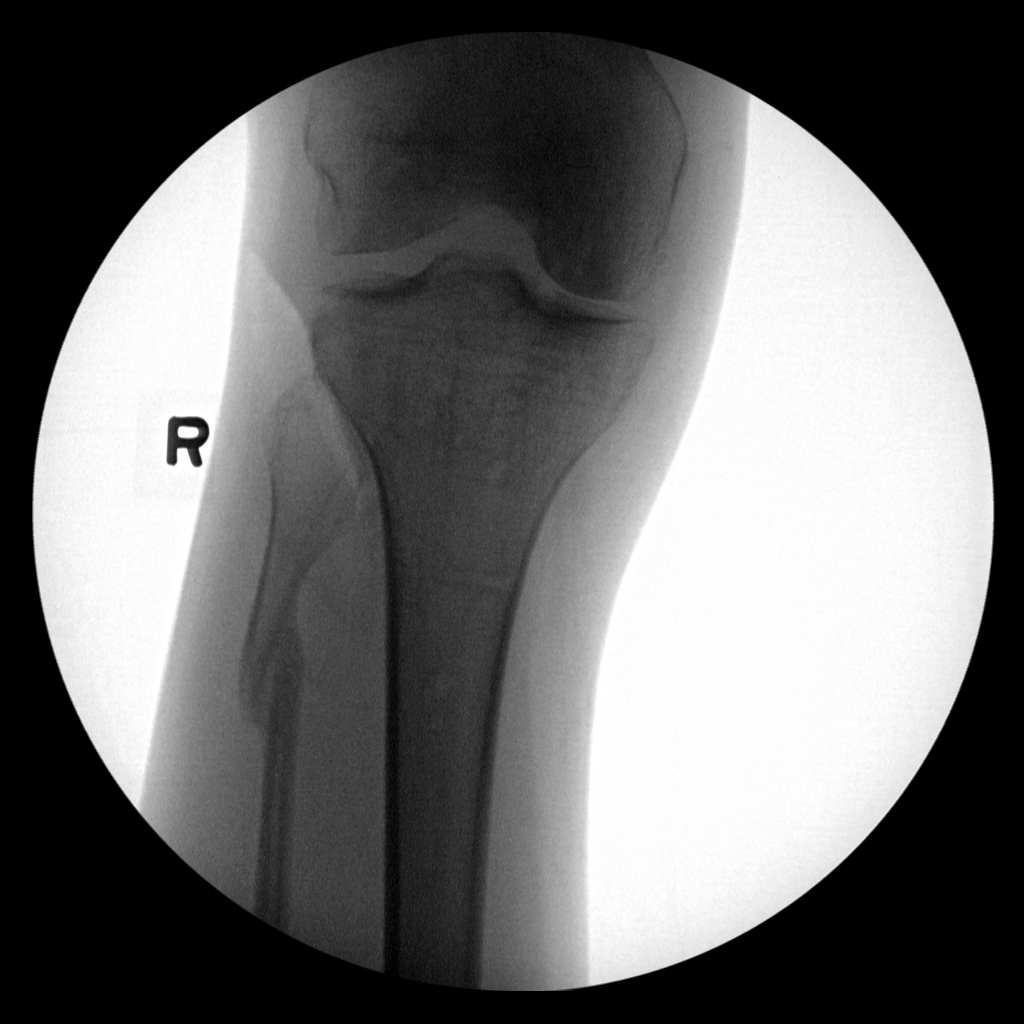

[4 of 4 positions shown; findings below may reference images not displayed]

FINDINGS: Multiple intraoperative spot images demonstrate hardware tracks
within the tibia. Healed mid tibial fracture and proximal fibular
fracture noted. No acute bony abnormality seen.
IMPRESSION: As above.
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
